# Patient Record
Sex: Female | Born: 1970
Health system: Northeastern US, Community
[De-identification: ages and names within clinical notes are randomized; demographics above are authoritative.]

## PROBLEM LIST (undated history)

## (undated) DIAGNOSIS — N390 Urinary tract infection, site not specified: Secondary | ICD-10-CM

## (undated) DIAGNOSIS — Q909 Down syndrome, unspecified: Secondary | ICD-10-CM

## (undated) DIAGNOSIS — I10 Essential (primary) hypertension: Secondary | ICD-10-CM

## (undated) DIAGNOSIS — N200 Calculus of kidney: Secondary | ICD-10-CM

## (undated) HISTORY — DX: Urinary tract infection, site not specified: N39.0

## (undated) HISTORY — DX: Down syndrome, unspecified: Q90.9

## (undated) HISTORY — PX: TONSILLECTOMY ONE-HALF AGE 12/>: ENT171

## (undated) HISTORY — DX: Essential (primary) hypertension: I10

## (undated) HISTORY — DX: Calculus of kidney: N20.0

---

## 1998-11-18 ENCOUNTER — Encounter (HOSPITAL_BASED_OUTPATIENT_CLINIC_OR_DEPARTMENT_OTHER): Payer: Self-pay

## 1998-11-18 ENCOUNTER — Ambulatory Visit (HOSPITAL_BASED_OUTPATIENT_CLINIC_OR_DEPARTMENT_OTHER): Payer: Self-pay | Admitting: Internal Medicine

## 1998-11-18 LAB — SURGICAL PATH SPECIMEN

## 1998-12-10 ENCOUNTER — Ambulatory Visit (HOSPITAL_BASED_OUTPATIENT_CLINIC_OR_DEPARTMENT_OTHER): Payer: Self-pay

## 1999-03-13 ENCOUNTER — Ambulatory Visit (HOSPITAL_BASED_OUTPATIENT_CLINIC_OR_DEPARTMENT_OTHER): Payer: Self-pay

## 1999-06-23 ENCOUNTER — Ambulatory Visit (HOSPITAL_BASED_OUTPATIENT_CLINIC_OR_DEPARTMENT_OTHER): Payer: Self-pay | Admitting: Internal Medicine

## 1999-08-04 ENCOUNTER — Ambulatory Visit (HOSPITAL_BASED_OUTPATIENT_CLINIC_OR_DEPARTMENT_OTHER): Payer: Self-pay | Admitting: Internal Medicine

## 1999-08-05 LAB — CHG CYTP SLIDES CERV/VAG MNL SCRN PHYSICIAN SUPV

## 1999-12-18 ENCOUNTER — Ambulatory Visit (HOSPITAL_BASED_OUTPATIENT_CLINIC_OR_DEPARTMENT_OTHER): Payer: Self-pay

## 2000-04-05 ENCOUNTER — Ambulatory Visit (HOSPITAL_BASED_OUTPATIENT_CLINIC_OR_DEPARTMENT_OTHER): Payer: Self-pay | Admitting: Internal Medicine

## 2000-06-07 ENCOUNTER — Emergency Department (HOSPITAL_BASED_OUTPATIENT_CLINIC_OR_DEPARTMENT_OTHER): Payer: Self-pay | Admitting: Emergency Medicine

## 2000-06-07 ENCOUNTER — Ambulatory Visit (HOSPITAL_BASED_OUTPATIENT_CLINIC_OR_DEPARTMENT_OTHER): Payer: Self-pay

## 2000-06-27 ENCOUNTER — Ambulatory Visit (HOSPITAL_BASED_OUTPATIENT_CLINIC_OR_DEPARTMENT_OTHER): Payer: Self-pay | Admitting: Internal Medicine

## 2000-10-17 ENCOUNTER — Ambulatory Visit (HOSPITAL_BASED_OUTPATIENT_CLINIC_OR_DEPARTMENT_OTHER): Payer: Self-pay | Admitting: Internal Medicine

## 2000-10-17 LAB — IADNA CHLAMYDIA TRACHOMATIS DIRECT PROBE TQ: GENPROBE CHLAMYDIA: NEGATIVE

## 2000-10-17 LAB — CHG CYTP SLIDES CERV/VAG MNL SCRN PHYSICIAN SUPV

## 2000-10-17 LAB — IADNA NEISSERIA GONORRHOEAE DIRECT PROBE TQ: GENPROBE GC: NEGATIVE

## 2001-08-05 ENCOUNTER — Emergency Department (HOSPITAL_BASED_OUTPATIENT_CLINIC_OR_DEPARTMENT_OTHER): Payer: Self-pay | Admitting: Emergency Medicine

## 2001-08-05 LAB — IADNA NEISSERIA GONORRHOEAE DIRECT PROBE TQ: GENPROBE GC: NEGATIVE

## 2001-08-05 LAB — IADNA CHLAMYDIA TRACHOMATIS DIRECT PROBE TQ: GENPROBE CHLAMYDIA: NEGATIVE

## 2001-08-05 LAB — URINE CULTURE/COLONY COUNT: URINE CULTURE/COLONY COUNT: NO GROWTH

## 2001-08-22 ENCOUNTER — Ambulatory Visit (HOSPITAL_BASED_OUTPATIENT_CLINIC_OR_DEPARTMENT_OTHER): Payer: Self-pay | Admitting: Internal Medicine

## 2001-08-22 LAB — IADNA NEISSERIA GONORRHOEAE DIRECT PROBE TQ: GENPROBE GC: NEGATIVE

## 2001-08-22 LAB — CYTOPATH, C/V, THIN LAYER

## 2001-08-22 LAB — IADNA CHLAMYDIA TRACHOMATIS DIRECT PROBE TQ: GENPROBE CHLAMYDIA: NEGATIVE

## 2002-10-19 ENCOUNTER — Emergency Department (HOSPITAL_BASED_OUTPATIENT_CLINIC_OR_DEPARTMENT_OTHER): Payer: Self-pay

## 2002-10-22 ENCOUNTER — Ambulatory Visit (HOSPITAL_BASED_OUTPATIENT_CLINIC_OR_DEPARTMENT_OTHER): Payer: Self-pay

## 2002-10-22 ENCOUNTER — Encounter (HOSPITAL_BASED_OUTPATIENT_CLINIC_OR_DEPARTMENT_OTHER): Payer: Self-pay | Admitting: Internal Medicine

## 2002-11-01 ENCOUNTER — Ambulatory Visit: Payer: Self-pay | Admitting: Ophthalmology

## 2002-11-02 ENCOUNTER — Ambulatory Visit: Payer: Self-pay | Admitting: Ophthalmology

## 2002-11-05 ENCOUNTER — Other Ambulatory Visit (HOSPITAL_BASED_OUTPATIENT_CLINIC_OR_DEPARTMENT_OTHER): Payer: Self-pay | Admitting: Ophthalmology

## 2002-11-07 LAB — CT CORONAL VIEW-ADDITIONAL VIE

## 2002-11-07 LAB — CT ORBITS W & WO CONTRAST

## 2002-11-07 LAB — CT HEAD W CONTRAST

## 2002-11-16 ENCOUNTER — Ambulatory Visit (HOSPITAL_BASED_OUTPATIENT_CLINIC_OR_DEPARTMENT_OTHER): Payer: Self-pay

## 2002-11-28 ENCOUNTER — Ambulatory Visit (HOSPITAL_BASED_OUTPATIENT_CLINIC_OR_DEPARTMENT_OTHER): Payer: Self-pay

## 2002-12-05 ENCOUNTER — Ambulatory Visit (HOSPITAL_BASED_OUTPATIENT_CLINIC_OR_DEPARTMENT_OTHER): Payer: Self-pay | Admitting: Internal Medicine

## 2002-12-22 LAB — URINALYSIS
BILIRUBIN, URINE: NEGATIVE
GLUCOSE, URINE: NEGATIVE MG/DL
KETONE, URINE: NEGATIVE MG/DL
LEUKOCYTE ESTERASE: NEGATIVE
NITRITE, URINE: NEGATIVE
OCCULT BLOOD, URINE: NEGATIVE
PH URINE: 6 (ref 5.0–8.0)
PROTEIN, URINE: NEGATIVE MG/DL
SPECIFIC GRAVITY URINE: 1.025 (ref 1.003–1.035)

## 2002-12-24 ENCOUNTER — Ambulatory Visit (HOSPITAL_BASED_OUTPATIENT_CLINIC_OR_DEPARTMENT_OTHER): Payer: Self-pay | Admitting: Internal Medicine

## 2002-12-24 ENCOUNTER — Ambulatory Visit (HOSPITAL_BASED_OUTPATIENT_CLINIC_OR_DEPARTMENT_OTHER): Payer: Self-pay

## 2002-12-27 ENCOUNTER — Ambulatory Visit (HOSPITAL_BASED_OUTPATIENT_CLINIC_OR_DEPARTMENT_OTHER): Payer: Self-pay | Admitting: Internal Medicine

## 2002-12-28 ENCOUNTER — Emergency Department (HOSPITAL_BASED_OUTPATIENT_CLINIC_OR_DEPARTMENT_OTHER): Payer: Self-pay | Admitting: Emergency Medicine

## 2003-01-01 ENCOUNTER — Ambulatory Visit (HOSPITAL_BASED_OUTPATIENT_CLINIC_OR_DEPARTMENT_OTHER): Payer: Self-pay | Admitting: Dermatology

## 2003-01-01 ENCOUNTER — Ambulatory Visit (HOSPITAL_BASED_OUTPATIENT_CLINIC_OR_DEPARTMENT_OTHER): Payer: Self-pay | Admitting: Internal Medicine

## 2003-02-06 ENCOUNTER — Ambulatory Visit (HOSPITAL_BASED_OUTPATIENT_CLINIC_OR_DEPARTMENT_OTHER): Payer: Self-pay | Admitting: Internal Medicine

## 2003-02-27 ENCOUNTER — Ambulatory Visit (HOSPITAL_BASED_OUTPATIENT_CLINIC_OR_DEPARTMENT_OTHER): Payer: Self-pay | Admitting: Dermatology

## 2003-02-27 ENCOUNTER — Ambulatory Visit (HOSPITAL_BASED_OUTPATIENT_CLINIC_OR_DEPARTMENT_OTHER): Payer: Self-pay | Admitting: Internal Medicine

## 2003-02-27 LAB — CYTOPATH, C/V, THIN LAYER

## 2003-03-12 ENCOUNTER — Ambulatory Visit (HOSPITAL_BASED_OUTPATIENT_CLINIC_OR_DEPARTMENT_OTHER): Payer: Self-pay

## 2003-03-12 LAB — TSH (THYROID STIMULATING HORMONE): TSH (THYROID STIM HORMONE): 0.41 u[IU]/mL (ref 0.34–5.60)

## 2003-03-21 ENCOUNTER — Ambulatory Visit (HOSPITAL_BASED_OUTPATIENT_CLINIC_OR_DEPARTMENT_OTHER): Payer: Self-pay | Admitting: Urology

## 2003-03-26 ENCOUNTER — Ambulatory Visit (HOSPITAL_BASED_OUTPATIENT_CLINIC_OR_DEPARTMENT_OTHER): Payer: Self-pay

## 2003-03-28 ENCOUNTER — Other Ambulatory Visit (HOSPITAL_BASED_OUTPATIENT_CLINIC_OR_DEPARTMENT_OTHER): Payer: Self-pay | Admitting: Internal Medicine

## 2003-04-02 LAB — CT CORONAL VIEW-ADDITIONAL VIE

## 2003-04-02 LAB — CT SINUS WO CONTRAST

## 2003-04-03 ENCOUNTER — Encounter (HOSPITAL_BASED_OUTPATIENT_CLINIC_OR_DEPARTMENT_OTHER): Payer: Charity | Admitting: Dermatology

## 2003-04-03 ENCOUNTER — Encounter (HOSPITAL_BASED_OUTPATIENT_CLINIC_OR_DEPARTMENT_OTHER): Payer: Self-pay | Admitting: Dermatology

## 2003-04-04 ENCOUNTER — Ambulatory Visit (HOSPITAL_BASED_OUTPATIENT_CLINIC_OR_DEPARTMENT_OTHER): Payer: Self-pay

## 2003-04-11 ENCOUNTER — Ambulatory Visit (HOSPITAL_BASED_OUTPATIENT_CLINIC_OR_DEPARTMENT_OTHER): Payer: Self-pay

## 2003-04-18 ENCOUNTER — Ambulatory Visit (HOSPITAL_BASED_OUTPATIENT_CLINIC_OR_DEPARTMENT_OTHER): Payer: Self-pay

## 2003-04-25 ENCOUNTER — Ambulatory Visit (HOSPITAL_BASED_OUTPATIENT_CLINIC_OR_DEPARTMENT_OTHER): Payer: Self-pay

## 2003-05-02 ENCOUNTER — Ambulatory Visit (HOSPITAL_BASED_OUTPATIENT_CLINIC_OR_DEPARTMENT_OTHER): Payer: Self-pay

## 2003-05-02 ENCOUNTER — Ambulatory Visit (HOSPITAL_BASED_OUTPATIENT_CLINIC_OR_DEPARTMENT_OTHER): Payer: Self-pay | Admitting: Internal Medicine

## 2003-05-06 ENCOUNTER — Encounter (HOSPITAL_BASED_OUTPATIENT_CLINIC_OR_DEPARTMENT_OTHER): Payer: Self-pay

## 2003-05-15 ENCOUNTER — Ambulatory Visit (HOSPITAL_BASED_OUTPATIENT_CLINIC_OR_DEPARTMENT_OTHER): Payer: Self-pay | Admitting: Internal Medicine

## 2003-06-06 ENCOUNTER — Ambulatory Visit (HOSPITAL_BASED_OUTPATIENT_CLINIC_OR_DEPARTMENT_OTHER): Payer: Self-pay

## 2003-07-24 ENCOUNTER — Ambulatory Visit (HOSPITAL_BASED_OUTPATIENT_CLINIC_OR_DEPARTMENT_OTHER): Payer: Self-pay | Admitting: Internal Medicine

## 2003-08-08 ENCOUNTER — Ambulatory Visit (HOSPITAL_BASED_OUTPATIENT_CLINIC_OR_DEPARTMENT_OTHER): Payer: Self-pay

## 2003-10-03 ENCOUNTER — Encounter (HOSPITAL_BASED_OUTPATIENT_CLINIC_OR_DEPARTMENT_OTHER): Payer: Charity

## 2003-10-03 ENCOUNTER — Encounter (HOSPITAL_BASED_OUTPATIENT_CLINIC_OR_DEPARTMENT_OTHER): Payer: Self-pay

## 2006-01-27 ENCOUNTER — Emergency Department: Payer: Self-pay | Admitting: Emergency Medicine

## 2006-09-15 ENCOUNTER — Emergency Department: Payer: Self-pay | Admitting: Emergency Medicine

## 2006-09-15 LAB — XR KNEE RIGHT 3 VIEWS

## 2006-09-20 ENCOUNTER — Encounter (HOSPITAL_BASED_OUTPATIENT_CLINIC_OR_DEPARTMENT_OTHER): Payer: Charity | Admitting: Physician Assistant

## 2006-09-20 DIAGNOSIS — M238X9 Other internal derangements of unspecified knee: Secondary | ICD-10-CM

## 2006-10-03 ENCOUNTER — Other Ambulatory Visit (HOSPITAL_BASED_OUTPATIENT_CLINIC_OR_DEPARTMENT_OTHER): Payer: Self-pay | Admitting: Orthopaedic Surgery

## 2006-10-04 LAB — MRI KNEE RIGHT WO CONTRAST

## 2006-10-06 LAB — EMERGENCY ROOM NOTE

## 2006-10-13 ENCOUNTER — Ambulatory Visit (HOSPITAL_BASED_OUTPATIENT_CLINIC_OR_DEPARTMENT_OTHER): Payer: PRIVATE HEALTH INSURANCE | Admitting: Orthopaedic Surgery

## 2006-10-13 DIAGNOSIS — M25569 Pain in unspecified knee: Secondary | ICD-10-CM

## 2006-10-24 LAB — ORTHOPEDIC OFFICE NOTE

## 2006-11-09 ENCOUNTER — Ambulatory Visit (HOSPITAL_BASED_OUTPATIENT_CLINIC_OR_DEPARTMENT_OTHER): Payer: PRIVATE HEALTH INSURANCE

## 2008-01-29 ENCOUNTER — Ambulatory Visit (HOSPITAL_BASED_OUTPATIENT_CLINIC_OR_DEPARTMENT_OTHER): Payer: PRIVATE HEALTH INSURANCE

## 2008-03-13 ENCOUNTER — Encounter (HOSPITAL_BASED_OUTPATIENT_CLINIC_OR_DEPARTMENT_OTHER): Payer: Self-pay

## 2008-03-13 ENCOUNTER — Ambulatory Visit (HOSPITAL_BASED_OUTPATIENT_CLINIC_OR_DEPARTMENT_OTHER): Payer: PRIVATE HEALTH INSURANCE

## 2008-03-13 VITALS — BP 128/84 | HR 71 | Temp 98.0°F | Ht 64.1 in | Wt 178.0 lb

## 2008-03-13 DIAGNOSIS — Z0000 Encounter for general adult medical examination without abnormal findings: Secondary | ICD-10-CM

## 2008-03-13 DIAGNOSIS — R102 Pelvic and perineal pain unspecified side: Secondary | ICD-10-CM

## 2008-03-13 LAB — URINALYSIS
BILIRUBIN, URINE: NEGATIVE
CASTS: NONE SEEN PER LPF
CRYSTALS: NONE SEEN
GLUCOSE, URINE: NEGATIVE MG/DL
KETONE, URINE: NEGATIVE MG/DL
LEUKOCYTE ESTERASE: NEGATIVE
NITRITE, URINE: NEGATIVE
PH URINE: 5.5 (ref 5.0–8.0)
PROTEIN, URINE: NEGATIVE MG/DL
SPECIFIC GRAVITY URINE: 1.015 (ref 1.003–1.035)
SQUAMOUS EPITHELIAL CELLS: >10 PER LPF — AB (ref 0–2)
WHITE BLOOD CELLS URINE: NONE SEEN PER HPF (ref 0–4)

## 2008-03-13 LAB — BLOOD COUNT COMPLETE AUTO&AUTO DIFRNTL WBC
BASOPHIL %: 0.9 % (ref 0.0–2.0)
EOSINOPHIL %: 3.3 % (ref 0.0–7.0)
HEMATOCRIT: 40.3 % (ref 36.0–48.0)
HEMOGLOBIN: 13.6 g/dl (ref 12.0–16.0)
LYMPHOCYTE %: 28.1 % (ref 13.0–39.0)
MEAN CORP HGB CONC: 33.7 g/dl (ref 32.0–36.0)
MEAN CORPUSCULAR HGB: 29.1 pg (ref 27.0–33.0)
MEAN CORPUSCULAR VOL: 86.4 fl (ref 80.0–100.0)
MEAN PLATELET VOLUME: 9 fl (ref 6.4–10.8)
MONOCYTE %: 6.1 % (ref 1.0–12.0)
NEUTROPHIL %: 61.6 % (ref 46.0–79.0)
PLATELET COUNT: 228 10*3/uL (ref 150–400)
RBC DISTRIBUTION WIDTH: 13.1 % (ref 11.5–14.3)
RED BLOOD CELL COUNT: 4.67 M/uL (ref 4.50–5.10)
WHITE BLOOD CELL COUNT: 8.9 10*3/uL (ref 4.0–10.8)

## 2008-03-13 LAB — COMPREHENSIVE METABOLIC PANEL
ALANINE AMINOTRANSFERASE: 15 IU/L (ref 7–35)
ALBUMIN: 3.5 g/dl (ref 3.4–4.8)
ALKALINE PHOSPHATASE: 71 IU/L (ref 25–106)
ANION GAP: 5 mmol/L (ref 2–25)
ASPARTATE AMINOTRANSFERASE: 15 IU/L (ref 8–34)
BILIRUBIN TOTAL: 0.5 mg/dl (ref 0.2–1.1)
BUN (UREA NITROGEN): 11 mg/dl (ref 6–20)
CALCIUM: 8.9 mg/dl (ref 8.6–10.0)
CARBON DIOXIDE: 27 mmol/L (ref 22–32)
CHLORIDE: 105 mmol/L (ref 101–111)
CREATININE: 0.7 mg/dl (ref 0.4–1.2)
ESTIMATED GLOMERULAR FILT RATE: >60 mL/min (ref 60–116)
Glucose Random: 84 mg/dl (ref 74–160)
POTASSIUM: 4.2 mmol/L (ref 3.5–5.1)
SODIUM: 137 mmol/L (ref 135–144)
TOTAL PROTEIN: 6.7 g/dl (ref 5.9–7.5)

## 2008-03-13 LAB — CHG SEDIMENTATION RATE RBC NON-AUTOMATED: RBC SEDIMENTATION RATE: 11 MM/HR (ref 0–15)

## 2008-03-13 LAB — CHG LIPID PANEL
Cholesterol: 193 mg/dl (ref 0–200)
HIGH DENSITY LIPOPROTEIN: 40 mg/dl (ref 35–85)
LOW DENSITY LIPOPROTEIN DIRECT: 136 mg/dl — ABNORMAL HIGH (ref 0–100)
RISK FACTOR: 4.8 — ABNORMAL HIGH (ref ?–4.4)
TRIGLYCERIDES: 150 mg/dl (ref 0–150)

## 2008-03-13 LAB — TSH (THYROID STIMULATING HORMONE): TSH (THYROID STIM HORMONE): 0.47 u[IU]/mL (ref 0.34–5.60)

## 2008-03-13 MED ORDER — MOTRIN 800 MG PO TABS
ORAL_TABLET | ORAL | Status: DC
Start: 2008-03-13 — End: 2008-03-20

## 2008-03-14 NOTE — Progress Notes (Signed)
c/c: yearly physical, pelvic pain    Denise Ryan is a 37 year old female, requesting yearly physical,   chornic pelvic pain, CT of abdomen shows no kidney sotne, but repeat ua   shows microscopic hematuria,   PLEASE DRINK 8 GLASSES OF WATER, AND DRINKING CRANBERRY JUICE ( GOOD FOR   KIDNEY AND BLADDER HEALTH) WITH FREQUENT URINATION, AVOID CALCIIUM   TINTAKE, MAINTAIN URINARY TRACT HEALTH. CALL CLINIC FOR ANY FLANK PAIN,   TENDERNESS AT BLADDER AREA    + pelvic pain as well, pelvic ultrasound ordered, for possible ovarian   cyst.       review of system  constitutional: NEG  HEENT:NEG  Cardiac: LDL 131  Resp:NEG  GI:NEG  WU:[id number] pelvic pain   Endocrine:NEG  Skin:NEG  Neuro:NEG  Extrmities: NEG  Psych: NEG    No past medical history on file.    Past Surgical History:   TONSILLECTOMY 1/2 AGE 50/> age 62     Review of patient's family history indicates:   Allergies Mother    Psychiatric Illness Mother    Comment: depression   Dermatology Father    Comment: leprosy    GI Maternal Grandmother    Comment: uterine cancer        Tobacco Use: Quit Packs/Day: Years:    Comment: 1 cig /week    Alcohol Use: Yes    Comment: socially       Review of Patient's Allergies indicates:   Norfloxacin Hives      Current outpatient prescriptions:  MOTRIN 800 MG OR TABS, 1 TABLET 3 TIMES DAILY AS NEEDED with food ( for   pain and inflammaiton ) , Disp: 40, Rfl: 0        Filed Vitals:  ----------------------    03/13/2008     9:40 AM   ----------------------   BP:  128/84    Pulse:  71    Temp:  98 F (36*    TempSrc: Oral    Height:  5' 4.1" (*    Weight:  178 lb (8*    SpO2:  100%   ----------------------    PHYSICAL EXAMINATION      GENERAL: awake and alert and orient timex 3 , no apprent distress  HENNT: EMOI, PERRL, no oral thrush, no erythema at phyanx, good hearing   acuity intacy, tampanic membrance intact  NECK: synnetruc, no Mass, no JVD, range to neck intact  Thyroid: Nontender, no  mass  RESP: Clear to ausc. percussion, no cracle / rale / wheeze  CARDIAC: NL PMI, normal s1,s2 no M/R/G   2+ radial, FEM, Distal pulses bilaterally  Chest: no nipple discharge, non tender  Brests: symmertirical, no skin changes, no lumps, no mass, no erythema  ABD: Good BS, nontender, No scar, No hernia, no hepatomegaly,   nondistension. no suprapubic tendnerness, intermittent pelvic pain, no   rebound, no guarding   MUSC: normal tone, no C/C/E  Skin: No rash, lesions ulers  Nero: CN 2-12 intact, no focal defecit      Psych: Normal Insight and affect  Lymph: no cervical, no supraclavicular, no axillary, an inginal lymph node   enlargement  EXTREMITIES; no pitting edema , no joint swelling or tender, or redness       Assesment and Plan:  Denise Ryan is a 37 year old female    V70.0 Routine General Medical Examination at a Health Care Facility   (primary encounter diagnosis)  Comment: ROUTINE ADULT PREVENTIVE HEALTH QUIDE LINE  Plan:  ROUTINE VENIPUNCTURE, COMPLETE CBC W/AUTO DIFF    WBC, LIPID PANEL, COMPREHENSIVE METABOLIC    PANEL, URINALYSIS, THYROID STIM HORMONE, RBC    SED RATE, NONAUTOMATED, MOTRIN 800 MG OR TABS       625.9D Pelvic Pain  Comment: for possible ovarian cyst  pelvic ultrasound ordered  Plan: MOTRIN 800 MG OR TABS             FOLLOW UP WITH DR. Kathie Rhodes Kendrik Mcshan IN CLINIC WITH RESULT IN 12 WEEKS.   PATIENT AGREES

## 2008-03-20 ENCOUNTER — Ambulatory Visit (HOSPITAL_BASED_OUTPATIENT_CLINIC_OR_DEPARTMENT_OTHER): Payer: PRIVATE HEALTH INSURANCE

## 2008-03-20 VITALS — BP 116/70 | HR 81 | Temp 97.8°F | Wt 178.2 lb

## 2008-03-20 DIAGNOSIS — R102 Pelvic and perineal pain unspecified side: Secondary | ICD-10-CM

## 2008-03-20 DIAGNOSIS — N390 Urinary tract infection, site not specified: Secondary | ICD-10-CM

## 2008-03-20 DIAGNOSIS — N200 Calculus of kidney: Secondary | ICD-10-CM

## 2008-03-20 DIAGNOSIS — N329 Bladder disorder, unspecified: Secondary | ICD-10-CM

## 2008-03-20 MED ORDER — AUGMENTIN 500-125 MG PO TABS
ORAL_TABLET | ORAL | Status: AC
Start: 2008-03-20 — End: 2008-03-27

## 2008-03-20 MED ORDER — DETROL 1 MG PO TABS
ORAL_TABLET | ORAL | Status: AC
Start: 2008-03-20 — End: 2008-05-20

## 2008-03-20 MED ORDER — MOTRIN 800 MG PO TABS
ORAL_TABLET | ORAL | Status: AC
Start: 2008-03-20 — End: 2008-03-27

## 2008-03-27 NOTE — Progress Notes (Signed)
c/c: pelvic pain, dyuria, proteinuria, urinary incontence    ZOX:WRUEAV Hole is a 37 year old female, c/c pelvic pain with dysuria,   + proteinuria, + urianry incotence upon coughing , no fever, no chill,   pelvic ultrsound ordered     review of system  constitutional: NEG  HEENT:NEG  Cardiac: NEG  Resp:NEG  GI:NEG  GU: dysuria, pelvic pain , urinary incontence   Endocrine:NEG  Skin:NEG  Neuro:NEG  Extrmities: NEG  Psych: NEG      Review of Patient's Allergies indicates:   Norfloxacin Hives    Current outpatient prescriptions:  DETROL 1 MG OR TABS, 1 TABLET TWICE DAILY ( for bladder control to prevent   urine leakage ) , Disp: 60, Rfl: 2        Filed Vitals:  ----------------------    03/20/2008     1:30 PM   ----------------------   BP:  116/70    Pulse:  81    Temp:  97.8 F (*    TempSrc: Temporal    Weight:  178 lb 3.*    SpO2:  100%   ----------------------    PHYSICAL EXAMINATION      GENERAL: awake and alert and orient timex 3 , no apprent distress      Thyroid: Nontender, no mass  RESP: Clear to ausc. percussion, no cracle / rale / wheeze  CARDIAC: NL PMI, normal s1,s2 no M/R/G   2+ radial, FEM, Distal pulses bilaterally    ABD: Good BS, nontender, No scar, No hernia, no hepatomegaly, nondistension   R flank pain, raidiated ot hip pain, and then pelvic pain      Assesment and Plan:  Denise Ryan is a 37 year old female    625.9D Pelvic Pain (primary encounter diagnosis)  Comment:   Plan: MOTRIN 800 MG OR TABS       599.0U UTI (Lower Urinary Tract Infection)  Comment:    Plan: URINE DIP (POINT OF CARE), AUGMENTIN 500-125 MG   OR TABS         596.9C Bladder Disease  Comment: urinary incontence  Plan: DETROL 1 MG OR TABS, REFERRAL TO UROLOGY (INT)       592.0J Kidney Stones  Comment: renal ultrasound ordered    PLEASE DRINK 8 GLASSES OF WATER, AND DRINKING CRANBERRY JUICE ( GOOD FOR   KIDNEY AND BLADDER HEALTH) WITH FREQUENT URINATION, AVOID CALCIIUM   TINTAKE, MAINTAIN  URINARY TRACT HEALTH. CALL CLINIC FOR ANY FLANK PAIN,   TENDERNESS AT BLADDER AREA        Plan: AUGMENTIN 500-125 MG OR TABS, MOTRIN 800 MG OR    TABS             FOLLOW UP WITH DR. Kathie Rhodes Suesan Mohrmann IN CLINIC WITH RESULT IN 4 WEEKS.   PATIENT AGREES

## 2008-03-29 ENCOUNTER — Encounter (HOSPITAL_BASED_OUTPATIENT_CLINIC_OR_DEPARTMENT_OTHER): Payer: Self-pay

## 2008-04-02 ENCOUNTER — Ambulatory Visit (HOSPITAL_BASED_OUTPATIENT_CLINIC_OR_DEPARTMENT_OTHER): Payer: Self-pay

## 2008-04-02 LAB — US TRANSVAGINAL NON-OB

## 2008-04-02 LAB — US 3D RENDERING WO DEDICATED WORKSTATION

## 2008-04-02 LAB — US PELVIC NON-PREGNANT

## 2008-04-05 ENCOUNTER — Ambulatory Visit (HOSPITAL_BASED_OUTPATIENT_CLINIC_OR_DEPARTMENT_OTHER): Payer: Self-pay | Admitting: Internal Medicine

## 2008-04-10 ENCOUNTER — Ambulatory Visit (HOSPITAL_BASED_OUTPATIENT_CLINIC_OR_DEPARTMENT_OTHER): Payer: PRIVATE HEALTH INSURANCE

## 2008-04-10 VITALS — BP 104/78 | HR 85 | Temp 98.5°F | Wt 179.0 lb

## 2008-04-10 DIAGNOSIS — R319 Hematuria, unspecified: Secondary | ICD-10-CM

## 2008-04-10 DIAGNOSIS — N200 Calculus of kidney: Secondary | ICD-10-CM

## 2008-04-10 DIAGNOSIS — R102 Pelvic and perineal pain unspecified side: Secondary | ICD-10-CM

## 2008-04-10 DIAGNOSIS — K219 Gastro-esophageal reflux disease without esophagitis: Secondary | ICD-10-CM

## 2008-04-10 HISTORY — DX: Calculus of kidney: N20.0

## 2008-04-10 LAB — URINALYSIS
BILIRUBIN, URINE: NEGATIVE
GLUCOSE, URINE: NEGATIVE MG/DL
KETONE, URINE: NEGATIVE MG/DL
LEUKOCYTE ESTERASE: NEGATIVE
NITRITE, URINE: NEGATIVE
OCCULT BLOOD, URINE: NEGATIVE
PH URINE: 6 (ref 5.0–8.0)
PROTEIN, URINE: NEGATIVE MG/DL
SPECIFIC GRAVITY URINE: 1.01 (ref 1.003–1.035)

## 2008-04-10 LAB — URINE DIP (POINT OF CARE)
BILIRUBIN, URINE: NEGATIVE (ref 0–0)
GLUCOSE, URINE: NEGATIVE mg/dl (ref 0–0)
KETONE, URINE: NEGATIVE mg/dl (ref 0–0)
LEUKOCYTE ESTERASE: NEGATIVE (ref 0–0)
NITRITE, URINE: NEGATIVE
PH URINE: 6.5 (ref 5.0–8.0)
PROTEIN, URINE: NEGATIVE mg/dl (ref 0–15)
SPECIFIC GRAVITY URINE: 1.02 (ref 1.003–1.030)
UROBILINOGEN URINE: 0.2 mg/dl (ref 0.2–1.0)

## 2008-04-10 MED ORDER — ACETAMINOPHEN-CODEINE #3 300-30 MG PO TABS
ORAL_TABLET | ORAL | Status: AC
Start: 2008-04-10 — End: 2008-05-11

## 2008-04-10 MED ORDER — OMEPRAZOLE 20 MG PO CPDR
DELAYED_RELEASE_CAPSULE | ORAL | Status: AC
Start: 2008-04-10 — End: 2008-05-11

## 2008-04-12 LAB — URINE CULTURE/COLONY COUNT

## 2008-04-12 NOTE — Progress Notes (Signed)
c/c: pelvic pain, dysuria, hemautira,     ZOX:WRUEAV Mcgeachy is a 37 year old female      review of system  constitutional: distress due to pelvic pain   HEENT:NEG  Cardiac: NEG  Resp:NEG  GI:NEG  GU: dysuria, pelvic pain , hemauria, E coli uti   Endocrine:NEG  Skin:NEG  Neuro:NEG  Extrmities: NEG  Psych: NEG        Review of Patient's Allergies indicates:   Norfloxacin Hives    Current outpatient prescriptions:  ACETAMINOPHEN-CODEINE #3 300-30 MG OR TABS, 1 TABLETS EVERY 8 HOURS AS   NEEDED for severe pain only ( no driving, it is addictive to the brain ) ,   Disp: 15, Rfl: 0<BR>OMEPRAZOLE 20 MG OR CPDR, 1 CAPSULE DAILY ( for acid   refelx ) , Disp: 30, Rfl: 0<BR>DETROL 1 MG OR TABS, 1 TABLET TWICE DAILY (   for bladder control to prevent urine leakage ) , Disp: 60, Rfl: 2          Filed Vitals:   ----------------------       04/10/2008        10:40 AM     ----------------------     BP:   104/78      Pulse:   85      Temp:   98.5 F (*      TempSrc:  Temporal      Weight:   179 lb (8*      SpO2:   100%     ----------------------       PHYSICAL EXAMINATION      GENERAL: awake and alert and orient timex 3 ,distress due to pelvic pain     RESP: Clear to ausc. percussion, no cracle / rale / wheeze  CARDIAC: NL PMI, normal s1,s2 no M/R/G   2+ radial, FEM, Distal pulses bilaterally      ABD: Good BS, nontender, No scar, No hernia, no hepatomegaly, nondistension    + surprapubic tenderness , no flank pain , no rebound, no guarding   Assesment and Plan:  Gaylen Pereira is a 37 year old female      625.9D Pelvic Pain (primary encounter diagnosis)  Comment: ovairan cyst vs kidne stones   Plan: URINE DIP (POINT OF CARE), URINALYSIS, URINE    CULTURE/COLONY COUNT, ACETAMINOPHEN-CODEINE #3    300-30 MG OR TABS       592.0A Kidney Stone  Comment: pelvic ultrsound ordered, with gram neg uti    Plan: URINALYSIS, URINE CULTURE/COLONY COUNT,    ACETAMINOPHEN-CODEINE #3 300-30 MG OR TABS       599.58F Hematuria  Comment: E coli uti    Plan: URINALYSIS, URINE CULTURE/COLONY COUNT       530.81K GERD (Gastroesophageal Reflux Disease)  Comment: PATIENT EDUCATION ABOUT HEALTHY EATING HABBIT, AVOID 3 HOURS   EATING BEFORE BED TIME, AVID COFFEE, AND ALCHOHOL, SPICY FOOD, CHOCOLATE,   EATING PROPER DIET, 3 MEALS PER DAY, CONSUMED FOOD THAT IS EASY TO DIGEST.    Plan: OMEPRAZOLE 20 MG OR CPDR           FOLLOW UP WITH DR. Kathie Rhodes Ryian Lynde IN CLINIC WITH RESULT IN 12 WEEKS.   PATIENT AGREES

## 2008-05-01 ENCOUNTER — Ambulatory Visit (HOSPITAL_BASED_OUTPATIENT_CLINIC_OR_DEPARTMENT_OTHER): Payer: PRIVATE HEALTH INSURANCE

## 2008-05-01 VITALS — BP 106/70 | HR 97 | Temp 98.3°F | Ht 64.25 in | Wt 177.3 lb

## 2008-05-01 DIAGNOSIS — N200 Calculus of kidney: Secondary | ICD-10-CM

## 2008-05-01 DIAGNOSIS — R21 Rash and other nonspecific skin eruption: Secondary | ICD-10-CM

## 2008-05-01 DIAGNOSIS — B3731 Acute candidiasis of vulva and vagina: Secondary | ICD-10-CM

## 2008-05-01 DIAGNOSIS — R102 Pelvic and perineal pain unspecified side: Secondary | ICD-10-CM

## 2008-05-01 DIAGNOSIS — N390 Urinary tract infection, site not specified: Secondary | ICD-10-CM

## 2008-05-01 DIAGNOSIS — R319 Hematuria, unspecified: Secondary | ICD-10-CM

## 2008-05-01 DIAGNOSIS — B373 Candidiasis of vulva and vagina: Secondary | ICD-10-CM

## 2008-05-01 DIAGNOSIS — N329 Bladder disorder, unspecified: Secondary | ICD-10-CM

## 2008-05-01 HISTORY — DX: Urinary tract infection, site not specified: N39.0

## 2008-05-01 LAB — URINALYSIS
BACTERIA: >50 PER HPF — AB (ref 0–?)
BILIRUBIN, URINE: NEGATIVE
CASTS: NONE SEEN PER LPF
CRYSTALS: NONE SEEN
GLUCOSE, URINE: NEGATIVE MG/DL
KETONE, URINE: NEGATIVE MG/DL
NITRITE, URINE: POSITIVE — AB
PH URINE: 5.5 (ref 5.0–8.0)
PROTEIN, URINE: NEGATIVE MG/DL
SPECIFIC GRAVITY URINE: 1.025 (ref 1.003–1.035)

## 2008-05-01 MED ORDER — AUGMENTIN 500-125 MG PO TABS
ORAL_TABLET | ORAL | Status: DC
Start: 2008-05-01 — End: 2008-05-27

## 2008-05-01 MED ORDER — TRIAMCINOLONE ACETONIDE (TOP) 0.025 % EX OINT
TOPICAL_OINTMENT | CUTANEOUS | Status: AC
Start: 2008-05-01 — End: 2008-10-31

## 2008-05-01 MED ORDER — FLUCONAZOLE 150 MG PO TABS
ORAL_TABLET | ORAL | Status: AC
Start: 2008-05-01 — End: 2008-05-08

## 2008-05-03 LAB — CHLAMYDIA GC NAAT
GENPROBE CHLAMYDIA: NEGATIVE
GENPROBE GC: NEGATIVE

## 2008-05-03 LAB — URINE CULTURE/COLONY COUNT

## 2008-05-03 LAB — HUMAN PAPILLOMAVIRUS (HPV): HUMAN PAPILLOMAVIRUS: NEGATIVE

## 2008-05-09 NOTE — Progress Notes (Signed)
c/c: kidney stone, dysuria, skin rash at extremties, vagianl discharge and   itch sensation    ZOX:WRUEAV Cornwall is a 37 year old female      review of system  constitutional: distress due to pain  HEENT:NEG  Cardiac: NEG  Resp:NEG  GI:NEG  GU: dysuria, history of kidney stones, hematuria, vaignal discharge and   itch sensation   Endocrine:NEG  Skin: skin rash at upper and lower extremties   Neuro:NEG  Extrmities: NEG  Psych: anxious mood due to pain           Review of Patient's Allergies indicates:   Norfloxacin Hives          Current outpatient prescriptions:  AUGMENTIN 500-125 MG OR TABS, 1 TABLET EVERY 12 HOURS FOR 7 DAYS (   antibiotic for Urine infection ) , Disp: 14, Rfl: 0<BR>TRIAMCINOLONE   ACETONIDE (TOP) 0.025 % EX OINT, Applied to affected area twice Daily (   for Allergy dermitis ) , Disp: 1, Rfl: 6<BR>ACETAMINOPHEN-CODEINE #3 300-  30 MG OR TABS, 1 TABLETS EVERY 8 HOURS AS NEEDED for severe pain only (   no driving, it is addictive to the brain ) , Disp: 15, Rfl: 0  OMEPRAZOLE 20 MG OR CPDR, 1 CAPSULE DAILY ( for acid refelx ) , Disp: 30,   Rfl: 0<BR>DETROL 1 MG OR TABS, 1 TABLET TWICE DAILY ( for bladder control   to prevent urine leakage ) , Disp: 60, Rfl: 2          Filed Vitals:   ----------------------       05/01/2008        2:30 PM     ----------------------     BP:   106/70      Pulse:   97      Temp:   98.3 F (*      TempSrc:  Temporal      Height:   5' 4.25" *      Weight:   177 lb 4.*      SpO2:   98%     ----------------------     PHYSICAL EXAMINATION          GENERAL: awake and alert and orient timex 3 , distress due to pain       Thyroid: Nontender, no mass  RESP: Clear to ausc. percussion, no cracle / rale / wheeze  CARDIAC: NL PMI, normal s1,s2 no M/R/G   2+ radial, FEM, Distal pulses bilaterally      ABD: Good BS, nontender, No scar, No hernia, no hepatomegaly, nondistension  + suprapubic tendnerness , mild flank pain , pelvic pain,       GYN: + white vaginal discharge, redness and  irritation at vulva area,   cervical oss is closed, moist, pink in color                Assesment and Plan:    Cindi Ghazarian is a 37 year old female        592.0A Kidney Stone (primary encounter diagnosis)  Comment: symtomatic  Plan: URINALYSIS, URINE CULTURE/COLONY COUNT         V72.31H Gyn Exam  Comment: Follow up cytology report, if abnotmal will refer to GYN    Plan: CYTOPATH, C/V, THIN LAYER, AMPLIFIED GENPROBE    CHLAM/GC, HUMAN PAPILLOMAVIRUS   PATIENT EDUCATION ABOUT THE SELF BREAST EXAMINATION MONTLY ONE WEEK   AFTER MENSES   PATIENT EDUCATION ABOUT THE IMPORTANT TO USE COMDOM TO PROTECT STD ,  HIV ,   HEPATITIS AND HERPES      625.9D Pelvic Pain  Comment:  Plan:    599.20F Hematuria  Comment:   Plan: URINALYSIS, URINE CULTURE/COLONY COUNT       596.9C Bladder Disease  Comment:  Plan: URINALYSIS, URINE CULTURE/COLONY COUNT         599.0U UTI (Lower Urinary Tract Infection)  Comment: symtomatic  Plan: URINALYSIS, URINE CULTURE/COLONY COUNT,    AUGMENTIN 500-125 MG OR TABS       782.1X Rash and Nonspecific Skin Eruption  Comment:     Plan: REFERRAL TO ALLERGY (EXT), TRIAMCINOLONE    ACETONIDE (TOP) 0.025 % EX OINT     112.1AY Yeast Infection of the Vagina  Comment:   Plan: FLUCONAZOLE 150 MG OR TABS             FOLLOW UP WITH DR. Kathie Rhodes Kensie Susman IN CLINIC WITH RESULT IN 12 WEEKS.   PATIENT AGREES

## 2008-05-10 LAB — CYTOPATH, C/V, THIN LAYER

## 2008-05-13 ENCOUNTER — Ambulatory Visit (HOSPITAL_BASED_OUTPATIENT_CLINIC_OR_DEPARTMENT_OTHER): Payer: PRIVATE HEALTH INSURANCE | Admitting: Urology

## 2008-05-13 DIAGNOSIS — N3000 Acute cystitis without hematuria: Secondary | ICD-10-CM

## 2008-05-13 DIAGNOSIS — R350 Frequency of micturition: Secondary | ICD-10-CM

## 2008-05-14 LAB — URINE CULTURE/COLONY COUNT: URINE CULTURE/COLONY COUNT: NO GROWTH

## 2008-05-16 LAB — CYTOLOGY SPECIMEN

## 2008-05-27 ENCOUNTER — Ambulatory Visit (HOSPITAL_BASED_OUTPATIENT_CLINIC_OR_DEPARTMENT_OTHER): Payer: PRIVATE HEALTH INSURANCE

## 2008-05-27 VITALS — BP 120/80 | HR 83 | Temp 98.4°F | Resp 18 | Ht 64.25 in | Wt 178.0 lb

## 2008-05-27 DIAGNOSIS — R42 Dizziness and giddiness: Secondary | ICD-10-CM

## 2008-05-27 DIAGNOSIS — R300 Dysuria: Secondary | ICD-10-CM

## 2008-05-27 NOTE — Progress Notes (Signed)
chief complaint  Not feeling well    Patient Active Problem List:     Pelvic Pain [625.9D]     Bladder Disease [596.9C]     Kidney Stone [592.0A]     Hematuria [599.40F]     GERD (Gastroesophageal Reflux Disease) [530.81K]     UTI (Lower Urinary Tract Infection) [599.0U]     Yeast Infection of the Vagina [112.1AY]      Current outpatient prescriptions prior to encounter:  TRIAMCINOLONE ACETONIDE (TOP) 0.025 % EX OINT Applied to affected area twice  Daily ( for  Allergy dermitis )  Disp: 1 Rfl: 6       SUBJECTIVE:  Pt  Is c/o 4 days of dizziness , she feels like she is going fall but she hasn't. This can happen 3 times in two hours today but lasts only seconds.  Worse when standing, if sits usually gets better.   She is taking one month of macrodantin that Dr. Ricki Miller Rx for her, her bladder symptoms have disappeared which she is very happy about.   She has no fever, sob, palpatations, cp , vom,N,d . Her legs sometimes feel funny but here are no other focal neuro. Symptoms.  OBJECTIVE:  BP 120/80  Pulse 83  Temp (Src) 98.4 F (36.9 C) (Temporal)  Resp 18  Ht 5' 4.25" (1.632 m)  Wt 178 lb (80.74 kg)  SpO2 99%  LMP 05/20/2008  UTI by culture 05/01/08   Well appearing with normal gait and balance,  CN grossly intact  Coordination gd, romberg neg  RRR, no murmur  Chest is clear  780.4A Dizziness  (primary encounter diagnosis)  Comment: very brief, lasting only seconds, not interfering with activities  Possible med s/e, is listed in adverse affects but not common,   Plan:  Reassurance, pt will call for increase in symptoms

## 2008-05-28 MED ORDER — OTHER MEDICATION
Status: AC
Start: 2008-05-27 — End: 2009-05-28

## 2008-06-10 ENCOUNTER — Ambulatory Visit (HOSPITAL_BASED_OUTPATIENT_CLINIC_OR_DEPARTMENT_OTHER): Payer: PRIVATE HEALTH INSURANCE | Admitting: Urology

## 2008-07-16 ENCOUNTER — Telehealth (HOSPITAL_BASED_OUTPATIENT_CLINIC_OR_DEPARTMENT_OTHER): Payer: Self-pay

## 2008-07-16 NOTE — Telephone Encounter (Signed)
Unable to reach pt, LM on VM to call back and make appt with Dr Nedra Hai to f/u dizziness

## 2008-07-16 NOTE — Telephone Encounter (Signed)
Staff Message copied by Ova Freshwater on Tue Jul 16, 2008 6:27 PM  ------   Message from: Murvin Donning   Created: Tue Jul 16, 2008 3:27 PM   Regarding: sick call - dizziness    Denise Ryan [id number], 37 year old, female, Telephone Information:  Home Phone [phone]  Work Phone [phone]      Denise Ryan NUMBER: [phone]    Best time to call back: anytimeCell phone:   Other phone:    Available times:    Patient's language of care: [place]    Patient does not need an interpreter.    Patient's PCP: Eileen Stanford, MD    Person calling on behalf of patient: Patient (self)    Calls today with a sick call.Pt said she saw Dr. Lauris Poag with dizziness a month ago. Call today with the same symptons. She would like To speak to the nurse.    Thanks    Patient's Preferred Pharmacy:   [facility]  Phone: [phone] Fax: [phone]

## 2008-07-17 ENCOUNTER — Ambulatory Visit (HOSPITAL_BASED_OUTPATIENT_CLINIC_OR_DEPARTMENT_OTHER): Payer: PRIVATE HEALTH INSURANCE

## 2008-07-17 VITALS — BP 100/70 | HR 79 | Temp 98.4°F | Wt 181.0 lb

## 2008-07-17 DIAGNOSIS — R42 Dizziness and giddiness: Secondary | ICD-10-CM

## 2008-07-17 LAB — SURG SPEC CLINIC NOTE

## 2008-07-17 NOTE — Progress Notes (Signed)
Pt w/in c/o feeling dizzy for 2-3 seconds off/on for the last 30 days.  Denied HA, blurred vision, chest pain, nausea, emesis, no increase of pulse, lightheadedness.  Denied taking medication OTC.   Symptoms  Happen at different time and situations. She can be walking, talking, or moving from bed/chair.  Advised by protocol - home care instructions for dizziness-  Advised if symptoms get worse, to call back or to go to ED.  Appt made to see Dr. Nedra Hai next week.  Pt agrees with the plan.

## 2008-07-23 ENCOUNTER — Ambulatory Visit (HOSPITAL_BASED_OUTPATIENT_CLINIC_OR_DEPARTMENT_OTHER): Payer: PRIVATE HEALTH INSURANCE

## 2008-07-23 VITALS — BP 124/80 | HR 73 | Temp 97.7°F | Wt 184.5 lb

## 2008-07-23 DIAGNOSIS — J019 Acute sinusitis, unspecified: Secondary | ICD-10-CM

## 2008-07-23 DIAGNOSIS — R42 Dizziness and giddiness: Secondary | ICD-10-CM

## 2008-07-23 DIAGNOSIS — J3089 Other allergic rhinitis: Secondary | ICD-10-CM

## 2008-07-23 DIAGNOSIS — H6690 Otitis media, unspecified, unspecified ear: Secondary | ICD-10-CM

## 2008-07-23 LAB — BLOOD COUNT COMPLETE AUTO&AUTO DIFRNTL WBC
BASOPHIL %: 1.8 % (ref 0.0–2.0)
EOSINOPHIL %: 4.2 % (ref 0.0–7.0)
HEMATOCRIT: 40.2 % (ref 36.0–48.0)
HEMOGLOBIN: 13.4 g/dl (ref 12.0–16.0)
LYMPHOCYTE %: 36.7 % (ref 13.0–39.0)
MEAN CORP HGB CONC: 33.2 g/dl (ref 32.0–36.0)
MEAN CORPUSCULAR HGB: 29 pg (ref 27.0–33.0)
MEAN CORPUSCULAR VOL: 87.2 fl (ref 80.0–100.0)
MEAN PLATELET VOLUME: 9 fl (ref 6.4–10.8)
MONOCYTE %: 8.3 % (ref 1.0–12.0)
NEUTROPHIL %: 49 % (ref 46.0–79.0)
PLATELET COUNT: 229 10*3/uL (ref 150–400)
RBC DISTRIBUTION WIDTH: 12.9 % (ref 11.5–14.3)
RED BLOOD CELL COUNT: 4.61 M/uL (ref 4.50–5.10)
WHITE BLOOD CELL COUNT: 9.7 10*3/uL (ref 4.0–10.8)

## 2008-07-23 LAB — COMPREHENSIVE METABOLIC PANEL
ALANINE AMINOTRANSFERASE: 18 IU/L (ref 7–35)
ALBUMIN: 3.4 g/dl (ref 3.4–4.8)
ALKALINE PHOSPHATASE: 70 IU/L (ref 25–106)
ANION GAP: 9 mmol/L (ref 2–25)
ASPARTATE AMINOTRANSFERASE: 18 IU/L (ref 8–34)
BILIRUBIN TOTAL: 0.3 mg/dl (ref 0.2–1.1)
BUN (UREA NITROGEN): 12 mg/dl (ref 6–20)
CALCIUM: 8.8 mg/dl (ref 8.6–10.3)
CARBON DIOXIDE: 28 mmol/L (ref 22–32)
CHLORIDE: 104 mmol/L (ref 101–111)
CREATININE: 0.7 mg/dl (ref 0.4–1.2)
ESTIMATED GLOMERULAR FILT RATE: >60 mL/min (ref 60–116)
Glucose Random: 70 mg/dl — ABNORMAL LOW (ref 74–160)
POTASSIUM: 3.9 mmol/L (ref 3.5–5.1)
SODIUM: 141 mmol/L (ref 135–144)
TOTAL PROTEIN: 6.4 g/dl (ref 5.9–7.5)

## 2008-07-23 MED ORDER — AUGMENTIN 875-125 MG PO TABS
ORAL_TABLET | ORAL | Status: AC
Start: 2008-07-23 — End: 2008-08-06

## 2008-07-23 MED ORDER — FLONASE 50 MCG/ACT NA SUSP
NASAL | Status: AC
Start: 2008-07-23 — End: 2008-08-22

## 2008-07-23 MED ORDER — MECLIZINE HCL 12.5 MG PO TABS
ORAL_TABLET | ORAL | Status: AC
Start: 2008-07-23 — End: 2008-08-22

## 2008-07-23 MED ORDER — B COMPLEX + C TR PO TBCR
EXTENDED_RELEASE_TABLET | ORAL | Status: DC
Start: 2008-07-23 — End: 2010-03-30

## 2008-07-29 NOTE — Progress Notes (Signed)
c/c: allergy sinuisits, vertigo, ear pain    Denise Ryan is a 38 year old female    review of system  constitutional: NEG  HEENT:NEG  Cardiac: NEG  Resp:NEG  GI:NEG  GU:NEG  Endocrine:NEG  Skin:NEG  Neuro:NEG  Extrmities: NEG  Psych: NEG      Review of Patient's Allergies indicates:   Norfloxacin Hives    Current outpatient prescriptions:  AUGMENTIN 875-125 MG OR TABS, 1 TABLET EVERY 12 HOURS for 7 days (   antibtioic for sinus, ear and throat infeciotn ) , Disp: 14, Rfl:   0<BR>FLONASE 50 MCG/ACT NA SUSP, One nasal spray to each nostril twice   daily for 30 days ( for nasal allergy sinusitis ) , Disp: 1, Rfl:   0<BR>MECLIZINE HCL 12.5 MG OR TABS, 1 TABLET 3 TIMES DAILY AS NEEDED for   diziness ( no drving ) , Disp: 20 , Rfl: 0  B COMPLEX + C TR OR TBCR, One tab by mouth daily ( increase immunse   system ) , Disp: 30, Rfl: 12<BR>OTHER MEDICATION, brazilian birth control   pills, Disp: 0, Rfl: 0<BR>TRIAMCINOLONE ACETONIDE (TOP) 0.025 % EX OINT,   Applied to affected area twice Daily ( for Allergy dermitis ) , Disp: 1,   Rfl: 6        Filed Vitals:  ----------------------    07/23/2008     10:20 AM   ----------------------   BP:  124/80    Pulse:  73    Temp:  97.7 F (*    TempSrc: Temporal    Weight:  184 lb 8 *   ----------------------    PHYSICAL EXAMINATION      GENERAL: awake and alert and orient timex 3 , distress due to vertigo   HENNT: EMOI, PERRL, + nasal mucosa erythema and congestion, + pressure and   pain at frontal sinus area , + pain and redness at external auditory   meatus, + no vertigo, no tinnutius no oral thrush, no erythema at phyanx,   good hearing acuity intacy, tampanic membrance intact  NECK: synnetruc, no Mass, no JVD, range to neck intact  Thyroid: Nontender, no mass  RESP: Clear to ausc. percussion, no cracle / rale / wheeze  CARDIAC: NL PMI, normal s1,s2 no M/R/G   2+ radial, FEM, Distal pulses bilaterally      ABD: Good BS, nontender, No scar, No hernia,  no hepatomegaly, nondistension        Assesment and Plan:      Denise Ryan is a 37 year old female      780.4K Vertigo (primary encounter diagnosis)  Comment:     Plan: COMPLETE CBC W/AUTO DIFF WBC, COMPREHENSIVE    METABOLIC PANEL, MECLIZINE HCL 40.9 MG OR TABS       461.9H Sinusitis, Acute  Comment:   Plan: ROUTINE VENIPUNCTURE, COMPLETE CBC W/AUTO DIFF    WBC, COMPREHENSIVE METABOLIC PANEL, AUGMENTIN    875-125 MG OR TABS, FLONASE 50 MCG/ACT NA SUSP       382.9K Middle Ear Infection  Comment:   Plan: AUGMENTIN 875-125 MG OR TABS       477.8 Allergic Rhinitis due to Other Allergen  Comment:     Plan: FLONASE 50 MCG/ACT NA SUSP, B COMPLEX + C TR OR   TBCR             FOLLOW UP WITH DR. Kathie Rhodes Denise Ryan IN CLINIC WITH RESULT IN 12 WEEKS.   PATIENT AGREES

## 2008-08-21 ENCOUNTER — Ambulatory Visit (HOSPITAL_BASED_OUTPATIENT_CLINIC_OR_DEPARTMENT_OTHER): Payer: PRIVATE HEALTH INSURANCE

## 2008-08-21 VITALS — BP 120/84 | HR 72 | Temp 98.0°F | Wt 185.0 lb

## 2008-08-21 DIAGNOSIS — J3089 Other allergic rhinitis: Secondary | ICD-10-CM

## 2008-08-21 DIAGNOSIS — R42 Dizziness and giddiness: Secondary | ICD-10-CM

## 2008-08-21 DIAGNOSIS — H6690 Otitis media, unspecified, unspecified ear: Secondary | ICD-10-CM

## 2008-08-21 DIAGNOSIS — J321 Chronic frontal sinusitis: Secondary | ICD-10-CM

## 2008-08-21 LAB — BLOOD COUNT COMPLETE AUTO&AUTO DIFRNTL WBC
BASOPHIL %: 1.3 % (ref 0.0–2.0)
EOSINOPHIL %: 3.5 % (ref 0.0–7.0)
HEMATOCRIT: 40.2 % (ref 36.0–48.0)
HEMOGLOBIN: 13.6 g/dl (ref 12.0–16.0)
LYMPHOCYTE %: 33.1 % (ref 13.0–39.0)
MEAN CORP HGB CONC: 33.8 g/dl (ref 32.0–36.0)
MEAN CORPUSCULAR HGB: 29.1 pg (ref 27.0–33.0)
MEAN CORPUSCULAR VOL: 86.1 fl (ref 80.0–100.0)
MEAN PLATELET VOLUME: 8.8 fl (ref 6.4–10.8)
MONOCYTE %: 6.9 % (ref 1.0–12.0)
NEUTROPHIL %: 55.2 % (ref 46.0–79.0)
PLATELET COUNT: 240 10*3/uL (ref 150–400)
RBC DISTRIBUTION WIDTH: 13 % (ref 11.5–14.3)
RED BLOOD CELL COUNT: 4.67 M/uL (ref 4.50–5.10)
WHITE BLOOD CELL COUNT: 9.5 10*3/uL (ref 4.0–10.8)

## 2008-08-21 MED ORDER — CORTISPORIN 3.5-10000-1 OT SUSP
OTIC | Status: AC
Start: 2008-08-21 — End: 2008-09-21

## 2008-08-21 MED ORDER — FLONASE 50 MCG/ACT NA SUSP
NASAL | Status: AC
Start: 2008-08-21 — End: 2008-10-21

## 2008-08-21 MED ORDER — AUGMENTIN 500-125 MG PO TABS
ORAL_TABLET | ORAL | Status: AC
Start: 2008-08-21 — End: 2008-09-04

## 2008-08-21 MED ORDER — MEDROL (PAK) 4 MG PO TABS
ORAL_TABLET | ORAL | Status: AC
Start: 2008-08-21 — End: 2008-09-21

## 2008-08-24 DIAGNOSIS — J3089 Other allergic rhinitis: Secondary | ICD-10-CM | POA: Insufficient documentation

## 2008-08-26 NOTE — Progress Notes (Addendum)
c/c: persistant allergy sinusiits, vertigo , ear pain despite   antibitoic treatment. consdier ENT consultation for nasal polyp deviation   or nasal polyps, no fever, no chill, no sob     ZOX:WRUEAV Denise Ryan is a 37 year old female      review of system  constitutional: distress due to vertigo , ear pain   HEENT:allergy sinusitis,m + vertigo   Cardiac: NEG  Resp:NEG  GI:NEG  GU:NEG  Endocrine:NEG  Skin:NEG  Neuro:NEG  Extrmities: NEG  Psych: NEG        Review of Patient's Allergies indicates:   Norfloxacin Hives    Current outpatient prescriptions:  AUGMENTIN 500-125 MG OR TABS, 1 TABLET EVERY 12 HOURS for 10 days (   antibitoic for sinus and ear infection ) , Disp: 20, Rfl: 0<BR>FLONASE 50   MCG/ACT NA SUSP, One nasal spray to each nostril twice daily for 30 days (   for nasal allergy sinusiits ) , Disp: 1, Rfl: 2<BR>MEDROL (PAK) 4 MG OR   TABS, Take it as directed ( low dose prednisone , it can redue   inflammation, pain and swelling of sinus and ear infection ) , Disp: 1,   Rfl: 0  CORTISPORIN 3.5-10000-1 OT SUSP, 3 drops to each ear twice daily for 7   days ( antibtioic ear drop ) , Disp: 1, Rfl: 0<BR>B COMPLEX + C TR OR   TBCR, One tab by mouth daily ( increase immunse system ) , Disp: 30, Rfl:   12<BR>OTHER MEDICATION, brazilian birth control pills, Disp: 0, Rfl:   0<BR>TRIAMCINOLONE ACETONIDE (TOP) 0.025 % EX OINT, Applied to affected   area twice Daily ( for Allergy dermitis ) , Disp: 1, Rfl: 6          Filed Vitals:  ----------------------    08/21/2008     11:40 AM   ----------------------   BP:  120/84    Pulse:  72    Temp:  98 F (36*    TempSrc: Temporal    Weight:  185 lb (8*   ----------------------    PHYSICAL EXAMINATION      GENERAL: awake and alert and orient timex 3 , no apprent distress  HENNT: EMOI, PERRL, no oral thrush, no erythema at phyanx, good hearing   acuity intacy, tampanic membrance intact  NECK: synnetruc, no Mass, no JVD, range to neck intact  Thyroid:  Nontender, no mass  RESP: Clear to ausc. percussion, no cracle / rale / wheeze  CARDIAC: NL PMI, normal s1,s2 no M/R/G   2+ radial, FEM, Distal pulses bilaterally  Chest: no nipple discharge, non tender  Brests: symmertirical, no skin changes, no lumps, no mass, no erythema  ABD: Good BS, nontender, No scar, No hernia, no hepatomegaly, nondistension  MUSC: normal tone, no C/C/E  Skin: No rash, lesions ulers  Nero: CN 2-12 intact, no focal defecit    Psych: Normal Insight and affect, fatitgue         Assesment and Plan:    Denise Ryan is a 37 year old female      780.4K Vertigo (primary encounter diagnosis)  Comment:   Plan: ROUTINE VENIPUNCTURE, COMPLETE CBC W/AUTO DIFF    WBC, CORTISPORIN 3.5-10000-1 OT SUSP, REFERRAL    TO ENT (INT)       473.1E Sinusitis Chronic, Frontal  Comment:   Plan: ROUTINE VENIPUNCTURE, COMPLETE CBC W/AUTO DIFF    WBC, AUGMENTIN 500-125 MG OR TABS, FLONASE 50    MCG/ACT NA SUSP, MEDROL (PAK)  4 MG OR TABS,    CORTISPORIN 3.5-10000-1 OT SUSP, REFERRAL TO    ENT (INT)         382.9A Ear Infection  Comment: symtomatic  , consider ENT consultaiton due to persitant vertigo, consider meniere   disease vs vestibular abnormality   Plan: ROUTINE VENIPUNCTURE, COMPLETE CBC W/AUTO DIFF    WBC, AUGMENTIN 500-125 MG OR TABS, MEDROL (PAK)   4 MG OR TABS, REFERRAL TO ENT (INT)         477.8 Allergic Rhinitis due to Other Allergen  Comment:   Plan: MEDROL (PAK) 4 MG OR TABS                     FOLLOW UP WITH DR. Kathie Rhodes Davari Lopes IN CLINIC WITH RESULT IN 12 WEEKS.   PATIENT AGREES

## 2008-10-09 ENCOUNTER — Ambulatory Visit (HOSPITAL_BASED_OUTPATIENT_CLINIC_OR_DEPARTMENT_OTHER): Payer: PRIVATE HEALTH INSURANCE | Admitting: Otolaryngology

## 2009-10-08 ENCOUNTER — Emergency Department (HOSPITAL_BASED_OUTPATIENT_CLINIC_OR_DEPARTMENT_OTHER)
Admission: RE | Admit: 2009-10-08 | Disposition: A | Discharge status: Home / Self Care | Payer: Self-pay | Source: Emergency Department | Attending: Physician Assistant | Admitting: Physician Assistant

## 2009-10-08 MED ORDER — CLOBETASOL PROPIONATE 0.05 % EX CREA
TOPICAL_CREAM | CUTANEOUS | Status: AC
Start: 2009-10-08 — End: 2009-11-07

## 2009-10-08 NOTE — ED Notes (Signed)
Patient c/o rash x 5 years. States needs rx for clobetasol 0.05%. Unable to see PCP. NAD

## 2009-10-16 LAB — EMERGENCY ROOM NOTE

## 2009-12-19 ENCOUNTER — Ambulatory Visit (HOSPITAL_BASED_OUTPATIENT_CLINIC_OR_DEPARTMENT_OTHER): Payer: PRIVATE HEALTH INSURANCE

## 2009-12-19 ENCOUNTER — Encounter (HOSPITAL_BASED_OUTPATIENT_CLINIC_OR_DEPARTMENT_OTHER): Payer: Self-pay

## 2009-12-19 VITALS — BP 120/80 | HR 77 | Temp 97.9°F | Resp 18 | Ht 64.0 in | Wt 180.0 lb

## 2009-12-19 DIAGNOSIS — M2550 Pain in unspecified joint: Secondary | ICD-10-CM

## 2009-12-19 DIAGNOSIS — E785 Hyperlipidemia, unspecified: Secondary | ICD-10-CM

## 2009-12-19 DIAGNOSIS — M549 Dorsalgia, unspecified: Secondary | ICD-10-CM

## 2009-12-19 DIAGNOSIS — J329 Chronic sinusitis, unspecified: Secondary | ICD-10-CM

## 2009-12-19 DIAGNOSIS — M069 Rheumatoid arthritis, unspecified: Secondary | ICD-10-CM

## 2009-12-19 DIAGNOSIS — M79606 Pain in leg, unspecified: Secondary | ICD-10-CM

## 2009-12-19 DIAGNOSIS — R21 Rash and other nonspecific skin eruption: Secondary | ICD-10-CM

## 2009-12-19 DIAGNOSIS — Z0000 Encounter for general adult medical examination without abnormal findings: Secondary | ICD-10-CM

## 2009-12-19 LAB — CBC, PLATELET & DIFFERENTIAL
BASOPHIL %: 1.6 % (ref 0.0–2.0)
EOSINOPHIL %: 3 % (ref 0.0–7.0)
HEMATOCRIT: 42 % (ref 36.0–48.0)
HEMOGLOBIN: 14 g/dl (ref 12.0–16.0)
LYMPHOCYTE %: 33.3 % (ref 13.0–39.0)
MEAN CORP HGB CONC: 33.3 g/dl (ref 32.0–36.0)
MEAN CORPUSCULAR HGB: 29.4 pg (ref 27.0–33.0)
MEAN CORPUSCULAR VOL: 88.1 fl (ref 80.0–100.0)
MEAN PLATELET VOLUME: 10.2 fl (ref 6.4–10.8)
MONOCYTE %: 7.7 % (ref 1.0–12.0)
NEUTROPHIL %: 54.4 % (ref 46.0–79.0)
PLATELET COUNT: 220 10*3/uL (ref 150–400)
RBC DISTRIBUTION WIDTH: 13.1 % (ref 11.5–14.3)
RED BLOOD CELL COUNT: 4.76 M/uL (ref 4.50–5.10)
WHITE BLOOD CELL COUNT: 9.5 10*3/uL (ref 4.0–10.8)

## 2009-12-19 LAB — CHG SEDIMENTATION RATE RBC NON-AUTOMATED: RBC SEDIMENTATION RATE: 10 MM/HR (ref 0–15)

## 2009-12-19 MED ORDER — CYCLOBENZAPRINE HCL 10 MG PO TABS
ORAL_TABLET | ORAL | Status: AC
Start: 2009-12-19 — End: 2010-01-09

## 2009-12-19 MED ORDER — AZITHROMYCIN 500 MG PO TABS
ORAL_TABLET | ORAL | Status: AC
Start: 2009-12-19 — End: 2009-12-26

## 2009-12-19 MED ORDER — MOTRIN 800 MG PO TABS
ORAL_TABLET | ORAL | Status: DC
Start: 2009-12-19 — End: 2010-03-30

## 2009-12-19 MED ORDER — FLONASE 50 MCG/ACT NA SUSP
NASAL | Status: AC
Start: 2009-12-19 — End: 2010-05-18

## 2009-12-19 MED ORDER — OMEGA-3 1000 MG PO CAPS
ORAL_CAPSULE | ORAL | Status: DC
Start: 2009-12-19 — End: 2010-10-28

## 2009-12-19 MED ORDER — MEDROL (PAK) 4 MG PO TABS
ORAL_TABLET | ORAL | Status: AC
Start: 2009-12-19 — End: 2010-01-16

## 2009-12-19 MED ORDER — CLOBETASOL PROPIONATE 0.05 % EX CREA
TOPICAL_CREAM | CUTANEOUS | Status: DC
Start: 2009-12-19 — End: 2010-10-28

## 2009-12-22 DIAGNOSIS — M069 Rheumatoid arthritis, unspecified: Secondary | ICD-10-CM | POA: Insufficient documentation

## 2009-12-22 LAB — TSH (THYROID STIMULATING HORMONE): TSH (THYROID STIM HORMONE): 0.64 u[IU]/mL (ref 0.34–5.60)

## 2009-12-22 LAB — CHG LIPID PANEL
Cholesterol: 218 mg/dl — ABNORMAL HIGH (ref 0–200)
HIGH DENSITY LIPOPROTEIN: 39 mg/dl (ref 35–85)
LOW DENSITY LIPOPROTEIN DIRECT: 158 mg/dl — ABNORMAL HIGH (ref 0–100)
RISK FACTOR: 5.6 — ABNORMAL HIGH (ref ?–4.4)
TRIGLYCERIDES: 97 mg/dl (ref 0–150)

## 2009-12-22 LAB — COMPREHENSIVE METABOLIC PANEL
ALANINE AMINOTRANSFERASE: 21 IU/L (ref 7–35)
ALBUMIN: 3.8 g/dl (ref 3.4–4.8)
ALKALINE PHOSPHATASE: 86 IU/L (ref 25–106)
ANION GAP: 4 mmol/L (ref 2–25)
ASPARTATE AMINOTRANSFERASE: 17 IU/L (ref 8–34)
BILIRUBIN TOTAL: 0.4 mg/dl (ref 0.2–1.1)
BUN (UREA NITROGEN): 15 mg/dl (ref 6–20)
CALCIUM: 9.3 mg/dl (ref 8.6–10.3)
CARBON DIOXIDE: 26 mmol/L (ref 22–32)
CHLORIDE: 107 mmol/L (ref 101–111)
CREATININE: 0.7 mg/dl (ref 0.4–1.2)
ESTIMATED GLOMERULAR FILT RATE: >60 mL/min (ref >60)
Glucose Random: 84 mg/dl (ref 74–160)
POTASSIUM: 4.4 mmol/L (ref 3.5–5.1)
SODIUM: 137 mmol/L (ref 135–144)
TOTAL PROTEIN: 6.8 g/dl (ref 5.9–7.5)

## 2009-12-22 LAB — RHEUMATOID FACTOR TITER: RHEUMATOID FACTOR TITER: 1:2 {titer}

## 2009-12-22 LAB — CHG RHEUMATOID FACTOR QUALITATIVE: RHEUMATOID FACTOR: POSITIVE — AB

## 2009-12-22 NOTE — Progress Notes (Signed)
[id number] Denise Ryan is a 40 year old female who presents to clinic for  For yearly physical, hypderlipdemia  C/c genearlized joint pain, back pain , leg pain, intermittent stiffness at morning  And swelling, , decreasing ROM at time, + RF , Rheumatology consultation ordered   Your blood work shows elevated cholesterol and bad cholesterol LDL , Hyperlipidemia at age 86 consider nutrtional consultation to prevent future  cardiac event

## 2010-01-09 ENCOUNTER — Ambulatory Visit (HOSPITAL_BASED_OUTPATIENT_CLINIC_OR_DEPARTMENT_OTHER): Payer: PRIVATE HEALTH INSURANCE | Admitting: Otolaryngology

## 2010-01-09 ENCOUNTER — Ambulatory Visit (HOSPITAL_BASED_OUTPATIENT_CLINIC_OR_DEPARTMENT_OTHER): Payer: Self-pay

## 2010-01-09 ENCOUNTER — Encounter (HOSPITAL_BASED_OUTPATIENT_CLINIC_OR_DEPARTMENT_OTHER): Payer: Self-pay | Admitting: Otolaryngology

## 2010-01-09 VITALS — BP 116/78 | HR 86 | Temp 98.0°F

## 2010-01-09 DIAGNOSIS — J018 Other acute sinusitis: Secondary | ICD-10-CM

## 2010-01-09 MED ORDER — AMOXICILLIN-POT CLAVULANATE 875-125 MG PO TABS
1.00 | ORAL_TABLET | Freq: Two times a day (BID) | ORAL | Status: AC
Start: 2010-01-09 — End: 2010-01-23

## 2010-01-09 NOTE — Progress Notes (Signed)
Denise Ryan     Date of Birth:  [birth year]     Date:  01/09/2010     Time:  10:02 AM  [facility] Progress Note  PCP requesting consult:  Eileen Stanford, MD  Consult requested for chronic sinusitis, request in EPIC  CC:  sinusitis  HPI:  39 year old female patient who has a sinus infection.  For the past year, her nose is very red on the inside.  One week ago, she started to have eye pain, and if she moves her eyes fast, she gets dizzy.  She has had headaches in the temple on the left intermittently over the past week.  Her PCP started her on antibiotic, but she does not know the name and it is not in EPIC.  Her paperwok shows that she has been given Zithromax.  She has been using the nasal spray fluticasone.  She was given methylprednisolone but she did not fill it.  She has not had nasal or sinus surgery.    Past medical, social and family history reviewed with patient and updated in EPIC.    REVIEW OF SYSTEMS  Fevers:  No  Blurred Vision:  No  Palpitations:  No  Cough:  No  Heartburn:  Yes  Problems with Urination:  No  Stiff Neck:  No  Headache:  Yes  Fullness of the lower neck:  No  Easy Bruising:  Yes  Itchy eyes:  No  Claustrophobia:  Yes  Skin lesions of the face:  No    EXAM  General:  WD WN female with a normal voice.  FACE:  No lesions.  Slightly tender to palpation in the glabella.  FACIAL STRENGTH:  Normal and symmetric  AD:  Ear no lesions.  Canal clear.  TM clear.  AS:  Ear no lesions.  Canal clear.  TM clear.  NOSE:  Septum has bilateral spurs inferiorly.  Turbinates normal size.  No polyps or masses.  NASOPHARYNX:  CNV  ORAL CAVITY/OROPHARYNX:  Mucous membranes moist.  Tonsils 0+.  Tongue no lesions.  Teeth in good condition.    HYPOPHARYNX:  Base of tongue no masses.  Piriform sinuses clear.  LARYNX:  Epiglottis no edema or lesions.  Vocal cords:  mobile with no lesions or polyps.  NECK:  Trachea midline.  No palpable masses.  LYMPH:  No palpable nodes.  SMG's:  Normal size and shape, non-tender.   PAROTIDS:  Normal size, no nodes or masses.    IMPRESSION/PLAN  Probable acute sinusitis.  I have changed the antibiotic to Augmentin as she is allergic to the quinolones.  I have continued the fluticasone.  She does not need to take the medrol dose pack.  I have instructed her to return should this not work or should the symptoms recur, in which case, she may need a CT scan.

## 2010-01-10 LAB — XR THORACIC SPINE 2 VIEWS

## 2010-01-10 LAB — XR CERVICAL SPINE WITH OBLIQUES 5 VIEWS

## 2010-01-10 LAB — XR TIBIA FIBULA RIGHT 2 VIEWS

## 2010-01-14 ENCOUNTER — Ambulatory Visit (HOSPITAL_BASED_OUTPATIENT_CLINIC_OR_DEPARTMENT_OTHER): Payer: PRIVATE HEALTH INSURANCE | Admitting: Rheumatology

## 2010-01-14 VITALS — BP 147/108 | HR 85

## 2010-01-14 DIAGNOSIS — M2140 Flat foot [pes planus] (acquired), unspecified foot: Secondary | ICD-10-CM

## 2010-01-14 DIAGNOSIS — T148XXA Other injury of unspecified body region, initial encounter: Secondary | ICD-10-CM

## 2010-01-14 NOTE — Patient Instructions (Addendum)
Exam does not show rheumatoid arthritis.  Rheumatoid factor blood test is positive at a low level that is not clinically significant.    Posture is correctable.  You need to resume the habit of standing up straight.  Weight gain deposits excess fat behind neck and in breasts and abdomen, as well as other locations.  Weight loss will help your posture and also will help elevated cholesterol and blood pressure.  Also see Dr. Nedra Hai about blood pressure (147/108 and 138/90)  Schedule PT for posture exercise.  Pronation of feet is worsened by overweight and can be relieved by wearing an arch support.  You can get them at athletic stores like [employer].  For weight loss; try to walk daily.  Do not eat between meals.  Eat vegetable oils but little of animal fat.  Do not eat trans saturated fats.  I will send you a letter with the result of the vitamin D test.

## 2010-01-14 NOTE — Progress Notes (Signed)
The primary progress note for this visit has been dictated through E-Scription. It can be viewed as an attachment to this encounter or through Chart Review under the Other Tab as an Rheumatology Office Note.      Review of Systems: Constitutional, Eyes, ENT/Mouth, Cardiovascular, Respiratory, GI, GU, Neuro, Psych, Heme/Lymph, Skin, Musculoskeletal was reviewed and is NEGATIVE except for what is dictated in the note.    MT ACCT #:  [id number]

## 2010-01-15 LAB — RHEUMATOLOGY OFFICE NOTE

## 2010-01-16 LAB — VITAMIN D,25 HYDROXY: VITAMIN D,25 HYDROXY: 19.5 ng/ml — CL (ref 30.0–100.0)

## 2010-01-20 ENCOUNTER — Other Ambulatory Visit (HOSPITAL_BASED_OUTPATIENT_CLINIC_OR_DEPARTMENT_OTHER): Payer: Self-pay | Admitting: Rheumatology

## 2010-01-20 ENCOUNTER — Encounter (HOSPITAL_BASED_OUTPATIENT_CLINIC_OR_DEPARTMENT_OTHER): Payer: Self-pay | Admitting: Rheumatology

## 2010-01-20 MED ORDER — ERGOCALCIFEROL 1.25 MG (50000 UT) PO CAPS
1.0000 | ORAL_CAPSULE | ORAL | Status: DC
Start: 2010-01-20 — End: 2010-10-28

## 2010-02-20 ENCOUNTER — Ambulatory Visit (HOSPITAL_BASED_OUTPATIENT_CLINIC_OR_DEPARTMENT_OTHER): Payer: PRIVATE HEALTH INSURANCE

## 2010-03-27 ENCOUNTER — Emergency Department (HOSPITAL_BASED_OUTPATIENT_CLINIC_OR_DEPARTMENT_OTHER)
Admission: RE | Admit: 2010-03-27 | Disposition: A | Discharge status: Home / Self Care | Payer: Self-pay | Source: Emergency Department | Attending: Emergency Medicine | Admitting: Emergency Medicine

## 2010-03-27 ENCOUNTER — Encounter (HOSPITAL_BASED_OUTPATIENT_CLINIC_OR_DEPARTMENT_OTHER): Payer: Self-pay

## 2010-03-27 NOTE — ED Notes (Signed)
C/o 8/10 h/a for 2 days.  Denies fever or trauma.  Taking motrin with no effect.

## 2010-03-27 NOTE — Discharge Instructions (Signed)
Diagnosis or diagnoses:  headache      What we did in the Emergency Department (ED):  - intravenous fluid  - medicine for headaches:  Reglan, Toradol, Tylenol (acetaminophen, paracetamol)    Next steps:  - drink more fluid!  Juice, soup, milk, water, Gatorade, coffee, tea... You need to drink enough to be peeing every 3 hours.  Your urine should be light straw in color.    - if your headache returns, you can try alternating ibuprofen with Tylenol as follows:       6:00am   Motrin/Advil (ibuprofen)  600 mg = 3 tablets     9:00am   Tylenol (acetaminophen)  975 mg = 3 regular strength tablets     12:00noon   Motrin/Advil     3:00pm   Tylenol     6:00pm   Motrin/Advil     9:00pm   Tylenol     Etcetera      Come back to the Emergency Department (ED) for:  Can't see, can't walk, can't talk, can't feel arm(s) or leg(s), worst headache of life.

## 2010-03-30 ENCOUNTER — Telehealth (HOSPITAL_BASED_OUTPATIENT_CLINIC_OR_DEPARTMENT_OTHER): Payer: Self-pay | Admitting: Ambulatory Care

## 2010-03-30 ENCOUNTER — Ambulatory Visit (HOSPITAL_BASED_OUTPATIENT_CLINIC_OR_DEPARTMENT_OTHER): Payer: PRIVATE HEALTH INSURANCE

## 2010-03-30 ENCOUNTER — Encounter (HOSPITAL_BASED_OUTPATIENT_CLINIC_OR_DEPARTMENT_OTHER): Payer: Self-pay

## 2010-03-30 VITALS — BP 130/80 | HR 60 | Temp 97.8°F | Resp 20 | Wt 175.0 lb

## 2010-03-30 DIAGNOSIS — R51 Headache: Secondary | ICD-10-CM

## 2010-03-30 DIAGNOSIS — H109 Unspecified conjunctivitis: Secondary | ICD-10-CM

## 2010-03-30 MED ORDER — CYCLOBENZAPRINE HCL 10 MG PO TABS
10.00 mg | ORAL_TABLET | Freq: Three times a day (TID) | ORAL | Status: AC | PRN
Start: 2010-03-30 — End: 2010-04-04

## 2010-03-30 MED ORDER — ERYTHROMYCIN 5 MG/GM OP OINT
TOPICAL_OINTMENT | Freq: Every evening | OPHTHALMIC | Status: AC
Start: 2010-03-30 — End: 2010-04-06

## 2010-03-30 NOTE — Telephone Encounter (Signed)
Message copied by Milus Mallick on Mon Mar 30, 2010 10:52 AM  ------       Message from: Larena Glassman       Created: Mon Mar 30, 2010 10:41 AM       Regarding: Er f/u headaches       Contact: [phone]         [facility]              Ovida Delagarza [id number], 39 year old, female, Telephone Information:       Home Phone      [phone]       Work Phone      Not on file.       Mobile          [phone]                     Cleotis Lema NUMBER: [phone]       Best time to call back:        Cell phone:        Other phone:              Available times:              Patient's language of care: [place]              Patient does not need an interpreter.              Patient's PCP: Eileen Stanford, MD              Person calling on behalf of patient: Patient (self)              Calls today        Er f/u headaches,not feeling better,medication given making her vomit.              Patient's Preferred Pharmacy:        [facility] ([facility]              Phone: [phone] Fax: [phone]

## 2010-03-30 NOTE — Progress Notes (Signed)
SUBJECTIVE:Callee Record is a 39 year old female who presents today for the first time with me for  f/u of an ED visit for left sided headache.    Complains of  left sided headache and eye pain x 4 days.  Headache happens daily,  is located in the left hemicranium, it is pulsatile, 8/10, no aura.  Also reports left sided neck pain, worse when she moves the neck.Was given motrim and tylenol , but it did not help      No cough  No post nasal drip  No nose obstruction  No sneezing  No photophonophobia, no nausea      Had an episode of left sided sinusitis treated in Feb 2011 with augmentin with good results. Not using flonase    The patient has been in otherwise good general health in the past.    Review of systems:  She denies fever.  No syncope.  No vision changes, but pain in the left eye for 1 day.  No excess lacrimation  Some epigastric pain after took motrim  Lots of stress @ work and at home    I have reviewed the patient's medical history in detail and updated the computerized patient record.    Review of patient's Past Medical History indicates:  Patient Active Problem List:     Pelvic Pain [625.9D]     Bladder Disease [596.9C]     Hematuria [599.41F]     GERD (Gastroesophageal Reflux Disease) [530.81K]     UTI (Lower Urinary Tract Infection) [599.0U]     Yeast Infection of the Vagina [112.1AY]     Allergic Rhinitis due to Other Allergen [477.8]     Pes planus [734L]        Current outpatient prescriptions:    Current outpatient prescriptions ordered prior to encounter:  ergocalciferol (ERGOCALCIFEROL) 50000 UNIT capsule Take 1 capsule by mouth once a week. Disp: 4 capsule Rfl: 2   OMEGA-3 1000 MG CAPS 3 CAPSULES DAILY WITH A MEAL  ( lower  The cholesterol /good for the joint ) Disp: 90 capsule Rfl: 12   clobetasol (TEMOVATE) 0.05 % CREA Applied to affected area twice daily  ( topical use ) Disp: 1 tube Rfl: 12   fluticasone (FLONASE) 50 MCG/ACT SUSP One nasal spray to each nostril twice daily  for 30 days  ( for nasal allergy sinusitis ) Disp: 12 g Rfl: 5             OBJECTIVE:Vital signs are as noted by the nurse.BP 130/80  Pulse 60  Temp(Src) 97.8 F (36.6 C) (Temporal)  Resp 20  Wt 175 lb (79.379 kg)  SpO2 94%  LMP 03/14/2010  SHe appears well, in no apparent distress.  Alert and oriented times three, pleasant and cooperative.     Fundi normal  No tenderness on palpation of sinuses  ENT: normal, neck supple, free of adenopathy or masses. Left red conjunctiva  Tenderness on palpation of neck muscles on the left  Extremities: Peripheral pulses are palpable.No edema.Reflexes are normal.  Neurological exam is normal without focal findings.  Skin: is normal without suspicious lesions.    ASSESSMENT/PLAN:  1. Headache: no neurologic symptoms. Unclear etiology:tension headache with component of MSK pain, vs migrane complicated by medication overuse? I do not think she is having now a sinus headache, but advised her to resume her flonase . The pain is also not intense or short enough for me to think of cluster headache. Reassurance. Advised to stop motrim  and tylenol. Try cyclobenzaprine for 5-7 days. Warm compresses in the left side of neck.     Patient will call in 5 days. If not improved by the end of the week, f/u appointment.    2. Left eye erythema: likely viral conjunctivitis. Treat with erthromycin op daily for 7 days

## 2010-03-30 NOTE — Telephone Encounter (Signed)
Called and spoke with pt and she said that she went to the ER last Friday for HA and was given some meds but the meds are making her stomach sick and vomiting.  Pt said that she is taking ibuprofen 3 times a day  Pain 8/10 now with some eye pressure pain.    Denies dizziness, SOB/CP at 340 pm

## 2010-03-31 ENCOUNTER — Ambulatory Visit (HOSPITAL_BASED_OUTPATIENT_CLINIC_OR_DEPARTMENT_OTHER): Payer: Self-pay | Admitting: Registered"

## 2010-05-18 LAB — EMERGENCY ROOM NOTE

## 2010-10-28 ENCOUNTER — Encounter (HOSPITAL_BASED_OUTPATIENT_CLINIC_OR_DEPARTMENT_OTHER): Payer: Self-pay

## 2010-10-28 ENCOUNTER — Ambulatory Visit (HOSPITAL_BASED_OUTPATIENT_CLINIC_OR_DEPARTMENT_OTHER): Payer: PRIVATE HEALTH INSURANCE

## 2010-10-28 VITALS — BP 120/84 | HR 74 | Temp 97.2°F | Resp 18 | Wt 182.0 lb

## 2010-10-28 DIAGNOSIS — N979 Female infertility, unspecified: Secondary | ICD-10-CM

## 2010-10-28 DIAGNOSIS — R21 Rash and other nonspecific skin eruption: Secondary | ICD-10-CM

## 2010-10-28 DIAGNOSIS — Z23 Encounter for immunization: Secondary | ICD-10-CM

## 2010-10-28 DIAGNOSIS — E559 Vitamin D deficiency, unspecified: Secondary | ICD-10-CM

## 2010-10-28 LAB — CBC, PLATELET & DIFFERENTIAL
BASOPHIL %: 1.4 % (ref 0.0–2.0)
EOSINOPHIL %: 3 % (ref 0.0–7.0)
HEMATOCRIT: 40.2 % (ref 36.0–48.0)
HEMOGLOBIN: 13.7 g/dl (ref 12.0–16.0)
LYMPHOCYTE %: 40.8 % — ABNORMAL HIGH (ref 13.0–39.0)
MEAN CORP HGB CONC: 34 g/dl (ref 32.0–36.0)
MEAN CORPUSCULAR HGB: 29.4 pg (ref 27.0–33.0)
MEAN CORPUSCULAR VOL: 86.3 fl (ref 80.0–100.0)
MEAN PLATELET VOLUME: 9.7 fl (ref 6.4–10.8)
MONOCYTE %: 6.8 % (ref 1.0–12.0)
NEUTROPHIL %: 48 % (ref 46.0–79.0)
PLATELET COUNT: 218 10*3/uL (ref 150–400)
RBC DISTRIBUTION WIDTH: 12.8 % (ref 11.5–14.3)
RED BLOOD CELL COUNT: 4.65 M/uL (ref 4.50–5.10)
WHITE BLOOD CELL COUNT: 8.9 10*3/uL (ref 4.0–10.8)

## 2010-10-28 LAB — URINE PREGNANCY TEST (POINT OF CARE): HCG QUALITATIVE URINE: NEGATIVE

## 2010-10-28 MED ORDER — CLOBETASOL PROPIONATE 0.05 % EX CREA
TOPICAL_CREAM | Freq: Two times a day (BID) | CUTANEOUS | Status: DC
Start: 2010-10-28 — End: 2012-01-10

## 2010-10-28 MED ORDER — PRENATAL 1 30-0.975-200 MG PO CAPS
1.0000 | ORAL_CAPSULE | Freq: Every day | ORAL | Status: DC
Start: 2010-10-28 — End: 2011-11-15

## 2010-10-30 LAB — CHG GONADOTROPIN LUTEINIZING HORMONE: LUTEINIZING HORMONE (LH): 9.84 m[IU]/mL

## 2010-10-30 LAB — VITAMIN D,25 HYDROXY: VITAMIN D,25 HYDROXY: 18.2 ng/ml — CL (ref 30.0–100.0)

## 2010-10-30 LAB — CHG GONADOTROPIN FOLLICLE STIMULATING HORMONE: FOLLICLE STIMULATING HORMONE: 10.03 m[IU]/mL

## 2010-10-30 LAB — TSH (THYROID STIMULATING HORMONE): TSH (THYROID STIM HORMONE): 1.07 u[IU]/mL (ref 0.34–5.60)

## 2010-11-02 ENCOUNTER — Encounter (HOSPITAL_BASED_OUTPATIENT_CLINIC_OR_DEPARTMENT_OTHER): Payer: Self-pay

## 2010-11-02 ENCOUNTER — Telehealth (HOSPITAL_BASED_OUTPATIENT_CLINIC_OR_DEPARTMENT_OTHER): Payer: Self-pay

## 2010-11-02 MED ORDER — ERGOCALCIFEROL 1.25 MG (50000 UT) PO CAPS
50000.0000 [IU] | ORAL_CAPSULE | ORAL | Status: DC
Start: 2010-11-02 — End: 2010-11-02

## 2010-11-02 NOTE — Progress Notes (Signed)
C/c: follow up vitmain D deficiency , age 39 , never pregnant, off contracetive for 6 months, like to check any possible abnormality for her infertility, hyperlipidemia , need tetanus shot and skin rash improve with cobetasol requesting for refill     ZOX:WRUEAV Shuffield is a 39 year old female who presents to clinic for  follow up vitmain D deficiency ,Your vitamin D level is low ,   Low vitamin D 25 hydroxyl level, it related to her joint pain,fatigue and depression , please  Using 30 mins  sun exposure  Around 4 pm and alloiwng your skin to make nauture active vitamin D druing pregnancy          age 50 , never pregnant, off contracetive for 6 months, like to check any possible abnormality for her infertility, normal TSH , FSH and LH , gyn consultiaotn and pelvic ultrasound ordered for patient , prenantal vitamin ordered       hyperlipidemia ,  PATIENT EDUCATION ABOUT LOW FAT, LOW SALT, HIGH FIBER, WITH VEGETABLE AND FRUIT, WHEAT, GRAIN, COOKING WITH GARLIC AND OLIVE OIL,  AND PROPER CARDIOVASCULAR EXERCISE REGIMEN.          Patient Active Problem List:     Pelvic Pain [625.9D]     Hematuria [599.72F]     GERD (Gastroesophageal Reflux Disease) [530.81K]     Allergic Rhinitis due to Other Allergen [477.8]     Pes planus [734L]     Infertility, female [628.69M]        Review of Patient's Allergies indicates:   Norfloxacin             Hives      Current outpatient prescriptions:  Prenatal MV-Min-Fe Fum-FA-DHA (PRENATAL 1) 30-0.975-200 MG CAPS Take 1 capsule by mouth daily. Disp: 1 each Rfl: 12   clobetasol (TEMOVATE) 0.05 % cream Apply  topically 2 (two) times daily. Disp: 1 tube Rfl: 12              10/28/10  1154   BP: 120/84   Pulse: 74   Temp: 97.2 F (36.2 C)   TempSrc: Temporal   Resp: 18   Weight: 182 lb (82.555 kg)   SpO2: 100%             constitutional: NEG  HEENT:NEG  Cardiac: NEG  Resp:NEG  GI:NEG  GU:NEG  Endocrine:NEG  Skin:NEG  Neuro:NEG  Extrmities: NEG  Psych: NEG      GENERAL: awake and alert and  orient timex 3 , no apprent distress  HENNT: EMOI, PERRL, no oral thrush, no erythema at phyanx, good hearing acuity intacy, tampanic membrance intact  NECK: synnetruc, no Mass, no JVD, range to neck intact  Thyroid: Nontender, no mass  RESP: Clear to ausc. percussion, no cracle / rale / wheeze  CARDIAC: NL PMI, normal s1,s2 no M/R/G                    2+ radial, FEM, Distal pulses bilaterally  Chest: no nipple discharge, non tender    ABD: Good BS, nontender, No scar, No hernia, no hepatomegaly, nondistension  MUSC: normal tone, no C/C/E  Skin: No rash, lesions ulers  Nero: CN 2-12 intact, no focal defecit      Psych: Normal Insight and affect  Lymph: no cervical, no supraclavicular, no axillary, an inginal lymph node enlargement  EXTREMITIES; no pitting edema , no joint swelling  or tender, or readness      Assesment and Plan:  Ricci Paff is a 39 year old female      628.45M Infertility, female  (primary encounter diagnosis)  Comment:  Negative urine hcg, normal FSH /LH and TSH       , age 89 , never pregnant, off contracetive for 6 months, like to check any possible abnormality for her infertility,        Plan: THYROID STIM HORMONE, FOLLICLE STIMULATING         HORMONE (FSH), LUTEINIZING HORMONE (LH),         COMPLETE CBC W/AUTO DIFF WBC, URINE PREGNANCY         TEST (POINT OF CARE), ORDER FOR ULTRASOUND,         Prenatal MV-Min-Fe Fum-FA-DHA (PRENATAL 1)         30-0.975-200 MG CAPS              V06.5 Need for prophylactic vaccination with tetanus-diphtheria (TD)  Comment:I did ordered tetanus booster vaccination for you , once every every 10 years pvevent infection form open wound during skin injury with open wound       Plan: TETANUS & DIPHTHERIA TOXOIDS ADSORBED 7 YR +         IM, IMMUNIZATION ADMIN SINGLE, RN              782.1R Rash  Comment: Improved with medication , requesting refill       symptomatic, stop using it if pregnancy occurred     Plan: THYROID STIM HORMONE, clobetasol (TEMOVATE)          0.05 % cream             268.9G Vitamin D deficiency  Comment: Your vitamin D level is low ,   Low vitamin D 25 hydroxyl level, it related to her joint pain,fatigue and depression , please  Using 30 mins  sun exposure  Around 4 pm and alloiwng your skin to make nauture active vitamin D druing pregnancy     Plan: VITAMIN D,25 HYDROXY, ergocalciferol          FOLLOW UP WITH DR. Kathie Rhodes Svetlana Bagby IN CLINIC WITH RESULT IN     4    WEEKS. For vitamin D deficiency at level 18  PATIENT AGREES

## 2010-11-02 NOTE — Telephone Encounter (Signed)
the result letter sent to patient     Thank you so much !

## 2010-11-02 NOTE — Telephone Encounter (Signed)
Dear [name] and [name]       Can we call this lovely patient for following issues :        1) the blood test shows very low active vitamin D level, 18 , I did ordered Ergocalciferol 50,000 one tab every Monday and Friday for 16 weeks .  It is not recommended for pregnant and women prepare for pregnancy , her urine pregnancy test is negative     2)She is desire and prepare for future pregnancy now  Taking  prenatal vitamin with infertility work up , if she can not wait for 4 months of treatment, she did not need pick up Ergocalciferol as prescribed     3) the result letter sent to patient     Thank you so much !

## 2010-11-02 NOTE — Telephone Encounter (Signed)
Voice mail to call clinic back using telephone interpreter

## 2010-11-05 ENCOUNTER — Telehealth (HOSPITAL_BASED_OUTPATIENT_CLINIC_OR_DEPARTMENT_OTHER): Payer: Self-pay | Admitting: Registered Nurse

## 2010-11-05 NOTE — Telephone Encounter (Signed)
Message copied by Olevia Bowens on Thu Nov 05, 2010 11:03 AM  ------       Message from: Natalia Leatherwood       Created: Thu Nov 05, 2010 10:53 AM       Regarding: lab result          [facility]              Atiana Levier [id number], 39 year old, female, Telephone Information:       Home Phone      [phone]       Work Phone      Not on file.       Mobile          [phone]                     Cleotis Lema NUMBER: [phone]       Best time to call back: anytime        Cell phone:        Other phone:              Available times:              Patient's language of care: [place]              Patient does not need an interpreter.              Patient's PCP: Eileen Stanford, MD              Person calling on behalf of patient: Patient (self)              Calls today Returning phonecall (pt said she got a vm concerning her latest lab result, pt haven't rec letter               Patient's Preferred Pharmacy:        [facility] ([facility])              Phone: [phone] Fax: [phone]

## 2010-11-05 NOTE — Telephone Encounter (Signed)
Call to patient. Reviewed lab results. To f/u prn.

## 2010-11-05 NOTE — Telephone Encounter (Signed)
Denise Stanford, MD 11/02/10 10:25 AM Signed   Dear [name] and [name]   Can we call this lovely patient for following issues :   1) the blood test shows very low active vitamin D level, 18 , I did ordered Ergocalciferol 50,000 one tab every Monday and Friday for 16 weeks . It is not recommended for pregnant and women prepare for pregnancy , her urine pregnancy test is negative   2)She is desire and prepare for future pregnancy now Taking prenatal vitamin with infertility work up , if she can not wait for 4 months of treatment, she did not need pick up Ergocalciferol as prescribed   3) the result letter sent to patient   Thank you so much !

## 2011-03-26 ENCOUNTER — Encounter (HOSPITAL_BASED_OUTPATIENT_CLINIC_OR_DEPARTMENT_OTHER): Payer: Self-pay | Admitting: Registered Nurse

## 2011-03-26 ENCOUNTER — Ambulatory Visit (HOSPITAL_BASED_OUTPATIENT_CLINIC_OR_DEPARTMENT_OTHER): Payer: No Typology Code available for payment source | Admitting: Registered Nurse

## 2011-03-26 DIAGNOSIS — I10 Essential (primary) hypertension: Secondary | ICD-10-CM

## 2011-03-26 DIAGNOSIS — Z3201 Encounter for pregnancy test, result positive: Secondary | ICD-10-CM

## 2011-03-26 LAB — URINE PREGNANCY TEST (POINT OF CARE): HCG QUALITATIVE URINE: POSITIVE — AB

## 2011-03-26 NOTE — Progress Notes (Signed)
Here for pregnancy test  LMP 02/20/11  Took home test and positive  Wanted to 'be sure'  HCG +  Super excited, thought it might be to late  Already taking pre-natal vitamin  Copy of medications safe in pregnacy given  encouraged to drink lots of water and get plenty of rest  BP high 150/100  Has a headache today that started yesterday  Much better with tylenol  Lied on left side for over 10 minutes  Rechecked 144/96, HR 80  Checked with in house CNM, P.Her  Encouraged to make apt with new PCP for next week  Patient would like to see Dr. Leone Haven made for Wednesday 1130  To report to ED if headaches worsen, sob, or chest pain  Agrees with plan

## 2011-03-31 ENCOUNTER — Ambulatory Visit (HOSPITAL_BASED_OUTPATIENT_CLINIC_OR_DEPARTMENT_OTHER): Payer: No Typology Code available for payment source

## 2011-03-31 ENCOUNTER — Encounter (HOSPITAL_BASED_OUTPATIENT_CLINIC_OR_DEPARTMENT_OTHER): Payer: Self-pay

## 2011-03-31 DIAGNOSIS — Z331 Pregnant state, incidental: Secondary | ICD-10-CM | POA: Insufficient documentation

## 2011-03-31 DIAGNOSIS — L309 Dermatitis, unspecified: Secondary | ICD-10-CM

## 2011-03-31 MED ORDER — HYDROCORTISONE 2.5 % EX CREA
TOPICAL_CREAM | Freq: Two times a day (BID) | CUTANEOUS | Status: AC
Start: 2011-03-31 — End: 2012-03-30

## 2011-03-31 NOTE — Progress Notes (Signed)
SUBJECTIVE:  Denise Ryan is a 40 year old female who presents here to f/up on BP in setting of new pregnancy.       Has rash on arm adn using Clobetasol.  OK to continue.     BPs have been fine in the past--except for last week when she had + pregnancy test.    Feeing well.   Very happy with pregnancy.    ROS:   General --no major fatigue  CV-- no CP  GI--no N or V  Neuro--occ hmild h/as  MEDICATIONS:    Current outpatient prescriptions:  Prenatal MV-Min-Fe Fum-FA-DHA (PRENATAL 1) 30-0.975-200 MG CAPS Take 1 capsule by mouth daily. Disp: 1 each Rfl: 12   clobetasol (TEMOVATE) 0.05 % cream Apply  topically 2 (two) times daily. Disp: 1 tube Rfl: 12         ALLERGIES:  Review of Patient's Allergies indicates:   Norfloxacin             Hives    OBJECTIVE:      PHYSICAL EXAM:  WD, obese  Woman who appears well NAD   03/31/11  1154   BP: 112/76   Pulse: 78   Temp: 98.1 F (36.7 C)   TempSrc: Temporal   Resp: 16   Height: 5\' 4"  (1.626 m)   Weight: 183 lb 8 oz (83.235 kg)   SpO2: 100%       HEENT-- nocongestion  Neck-- supple , no masses  Cor--S1S2 reg no M  Ext-- no CCE  Skin -- pink papules R upper arm, medial surface      ASSESSMENT:   40 yo woman seeing me for the first time with recent dx of pregnancy and ? HTN    659.60G Advanced maternal age  (primary encounter diagnosis)  Comment: BP was measured as high at nursing visit a few days ago but appears to be normal today.  WIll not give any treatment today but would monitor closely.        V22.2T Normal pregnancy  Comment: On prenatal vits.  Has OB appt set in a few weeks.      692.9BU Eczema  Comment: Change from Clobetasol to hydrocortisone.  Plan: hydrocortisone 2.5 % cream                  F/up as planned with OB.        1. The patient indicates understanding of these issues and agrees with the plan.  2.  The patient is given an After Visit Summary sheet that lists all of their medications with directions, their allergies, orders placed during  this encounter, immunization dates, and follow- up instructions.  3. I reviewed the patient's medical information and medical history   4.  I reconciled the patient's medication list and prepared and supplied needed refills.  5.  I have reviewed the past medical, family, and social history sections including the medications and allergies listed in the above medical record      Electronically signed by: Shelah Lewandowsky, MD, 03/31/2011 12:33 PM  This note is electronically signed in the electronic medical record.

## 2011-04-14 ENCOUNTER — Telehealth (HOSPITAL_BASED_OUTPATIENT_CLINIC_OR_DEPARTMENT_OTHER): Payer: Self-pay | Admitting: Obstetrics & Gynecology

## 2011-04-14 NOTE — Telephone Encounter (Signed)
left vm reminding pt of appt on 5/30

## 2011-04-15 ENCOUNTER — Ambulatory Visit (HOSPITAL_BASED_OUTPATIENT_CLINIC_OR_DEPARTMENT_OTHER): Payer: No Typology Code available for payment source

## 2011-04-15 ENCOUNTER — Ambulatory Visit (HOSPITAL_BASED_OUTPATIENT_CLINIC_OR_DEPARTMENT_OTHER): Payer: No Typology Code available for payment source | Admitting: Obstetrics & Gynecology

## 2011-04-15 ENCOUNTER — Encounter (HOSPITAL_BASED_OUTPATIENT_CLINIC_OR_DEPARTMENT_OTHER): Payer: Self-pay | Admitting: Obstetrics & Gynecology

## 2011-04-15 DIAGNOSIS — Z7189 Other specified counseling: Secondary | ICD-10-CM

## 2011-04-15 DIAGNOSIS — IMO0002 Reserved for concepts with insufficient information to code with codable children: Secondary | ICD-10-CM

## 2011-04-15 NOTE — Progress Notes (Signed)
This encounter was opened in error.  Please disregard.

## 2011-04-15 NOTE — Progress Notes (Addendum)
Addended by: Army Fossa on: 04/15/2011      Modules accepted: Orders

## 2011-04-15 NOTE — Progress Notes (Signed)
Pt is covered by 'Partial H[employer]' with an annual family deductible of approximately $3,500 dollars.    SCW explained to pt that she should report the pregnancy/EDD to [facility] ASAP because the family size will increase when the baby (pregnancy) is added to the MH case and, consequently, the deductible will decrease or the type of their coverage will change to H[employer] Primary (pt's husband) or to [facility] plus [employer] (for pt).   SCW referred pt to the [facility] and gave directions to the office in [place].

## 2011-04-20 ENCOUNTER — Telehealth (HOSPITAL_BASED_OUTPATIENT_CLINIC_OR_DEPARTMENT_OTHER): Payer: Self-pay

## 2011-04-20 NOTE — Telephone Encounter (Signed)
Pt's ultrasound is on 04/22/11 @ [facility]. Left message on pt's answering machine regarding specialty appt.date.

## 2011-04-21 LAB — US PREG UTERUS REAL TIME W/IMAGE DCMTN TRANSVAG

## 2011-04-21 LAB — US OB 1ST TRIMESTER

## 2011-04-22 ENCOUNTER — Ambulatory Visit (HOSPITAL_BASED_OUTPATIENT_CLINIC_OR_DEPARTMENT_OTHER): Payer: Self-pay | Admitting: Obstetrics & Gynecology

## 2011-04-22 DIAGNOSIS — O09519 Supervision of elderly primigravida, unspecified trimester: Secondary | ICD-10-CM

## 2011-04-23 NOTE — Progress Notes (Addendum)
Quick Note:    Ultrasound c/w reported LMP.  ______

## 2011-05-03 ENCOUNTER — Telehealth (HOSPITAL_BASED_OUTPATIENT_CLINIC_OR_DEPARTMENT_OTHER): Payer: Self-pay | Admitting: Advanced Practice Midwife

## 2011-05-03 ENCOUNTER — Encounter (HOSPITAL_BASED_OUTPATIENT_CLINIC_OR_DEPARTMENT_OTHER): Payer: Self-pay | Admitting: Advanced Practice Midwife

## 2011-05-03 ENCOUNTER — Ambulatory Visit (HOSPITAL_BASED_OUTPATIENT_CLINIC_OR_DEPARTMENT_OTHER): Payer: No Typology Code available for payment source | Admitting: Advanced Practice Midwife

## 2011-05-03 ENCOUNTER — Ambulatory Visit (HOSPITAL_BASED_OUTPATIENT_CLINIC_OR_DEPARTMENT_OTHER): Payer: No Typology Code available for payment source

## 2011-05-03 VITALS — BP 118/84 | HR 92 | Temp 98.1°F | Resp 18 | Ht 64.0 in | Wt 182.0 lb

## 2011-05-03 DIAGNOSIS — Z7189 Other specified counseling: Secondary | ICD-10-CM

## 2011-05-03 DIAGNOSIS — Z111 Encounter for screening for respiratory tuberculosis: Secondary | ICD-10-CM

## 2011-05-03 DIAGNOSIS — Z8279 Family history of other congenital malformations, deformations and chromosomal abnormalities: Secondary | ICD-10-CM | POA: Insufficient documentation

## 2011-05-03 DIAGNOSIS — R0989 Other specified symptoms and signs involving the circulatory and respiratory systems: Secondary | ICD-10-CM | POA: Insufficient documentation

## 2011-05-03 DIAGNOSIS — O09519 Supervision of elderly primigravida, unspecified trimester: Secondary | ICD-10-CM

## 2011-05-03 LAB — CBC WITH PLATELET
HEMATOCRIT: 38 % (ref 36.0–48.0)
HEMOGLOBIN: 13 g/dl (ref 12.0–16.0)
MEAN CORP HGB CONC: 34.2 g/dl (ref 32.0–36.0)
MEAN CORPUSCULAR HGB: 30 pg (ref 27.0–33.0)
MEAN CORPUSCULAR VOL: 87.7 fl (ref 80.0–100.0)
MEAN PLATELET VOLUME: 8.9 fl (ref 6.4–10.8)
PLATELET COUNT: 190 10*3/uL (ref 150–400)
RBC DISTRIBUTION WIDTH: 13.6 % (ref 11.5–14.3)
RED BLOOD CELL COUNT: 4.34 M/uL — ABNORMAL LOW (ref 4.50–5.10)
WHITE BLOOD CELL COUNT: 9.7 10*3/uL (ref 4.0–10.8)

## 2011-05-03 LAB — TYPE AND SCREEN
ABO/RH INTERPRETATION: O POS
ANTIBODY SCREEN: NEGATIVE

## 2011-05-03 NOTE — Telephone Encounter (Signed)
Appointment scheduled for PPD check on 05/05/11 at 9:30 am with Hughes Better. I left a message for the patient to return my call.

## 2011-05-03 NOTE — Progress Notes (Signed)
New Prenatal Visit  Planned pregnancy. Very happy. Husband's first pregnancy also  Taking Prenatal vitamins  BP elevated this years not consistently but more than once: counseled may need close monitoring increase risk of Pre-eclampsia  Obese: Nutritionist counseling  AMA : Counseled on risk HTN DM SAB spontaneous genetic problems including aneuploidy, may require close monitoring during pregnancy  Family Hx of down syndrome: increase patient risk of fetus with down syndrome  Works in a Dance movement psychotherapist and a shop  Review testing for [employer] : See check list patient will start with ERA and considered more confirmatory testing  Has " hives" she has had since she had taken norfloxacin and that it comes and goes and has taken a number of medications  to amenerate  And also a biopsy.  hydrocortisone cream good result. Uses periodically      S:    no complaints    ROS:  Constitutional: benign  Allergy: none  Endocrine: negative  Dermatologic: negative and rash  Cardiovascular: negative  Respiratory: negative  Gastrointestinal: negative  Genitourinary: negative  Musculoskeletal: negative  Neurological: negative  Psychiatric: negative    O:    See the Prenatal Physical section of this visit to enter and/or review both the general and pelvic exam for this patient.    See the Prenatal Vitals section of this visit to enter and/or review weight, BP and other relevant pregnancy related details.      A:    Pregnancy at [redacted]w[redacted]d  EDD based on LMP and 9 wk [place]  S=D        P:     - Routine prenatal labs ordered.  HIV counseling done.   - Discussed testing: genetic testing, AFP screen, U/S in pregnancy and amniocentesis  Discussed social factors: nutrition, alcohol use and social situation   - Reviewed risks related to the environment, eating fish, raw milk, raw meat, cats, gardening and over heating.   - Warning signs reviewed: instructed to call for pain, bleeding, dysuria, increased nausea and vomiting and any other concerns.   - Reviewed usual  schedule of visits and how to call office.   - Follow up: 4 weeks.  - Schedule ERA  -Review practice CNM and MD pt prefers CNM practice. Advised may need MD consultation if BP increase. Pt understands  -PPD today  NV review DV husband at visit.  Mare Ferrari CNM

## 2011-05-04 ENCOUNTER — Telehealth (HOSPITAL_BASED_OUTPATIENT_CLINIC_OR_DEPARTMENT_OTHER): Payer: Self-pay | Admitting: Registered"

## 2011-05-04 LAB — HGB ELECTROPHORESIS
HEMOGLOBIN A1A3: 97.4 % (ref 96.0–99.5)
HEMOGLOBIN A2: 2.7 % (ref 2.0–3.5)
HEMOGLOBIN C: 0 % (ref 0–0)
HEMOGLOBIN F: 0 % (ref 0–2.0)
HEMOGLOBIN S: 0 % (ref 0–0)

## 2011-05-04 LAB — CHLAMYDIA GC NAAT
GENPROBE CHLAMYDIA: NEGATIVE
GENPROBE GC: NEGATIVE

## 2011-05-04 NOTE — Telephone Encounter (Signed)
Patient returned my call and appointment scheduled for the [facility]atal [facility] on 05/26/11 at 11:00 am with Dr.Goldman, Idalia Needle and [place].

## 2011-05-04 NOTE — Telephone Encounter (Signed)
Prenatal Nutrition Group. I left a message for the patient to return my call.

## 2011-05-05 ENCOUNTER — Telehealth (HOSPITAL_BASED_OUTPATIENT_CLINIC_OR_DEPARTMENT_OTHER): Payer: Self-pay | Admitting: Advanced Practice Midwife

## 2011-05-05 ENCOUNTER — Ambulatory Visit (HOSPITAL_BASED_OUTPATIENT_CLINIC_OR_DEPARTMENT_OTHER): Payer: No Typology Code available for payment source | Admitting: Registered Nurse

## 2011-05-05 DIAGNOSIS — Z111 Encounter for screening for respiratory tuberculosis: Secondary | ICD-10-CM

## 2011-05-05 DIAGNOSIS — O2340 Unspecified infection of urinary tract in pregnancy, unspecified trimester: Secondary | ICD-10-CM

## 2011-05-05 LAB — HEPATITIS B SURFACE ANTIGEN: HEPATITIS B SURFACE ANTIGEN: NONREACTIVE

## 2011-05-05 LAB — RUBELLA IGG ANTIBODY: RUBELLA: 406 IU/mL — ABNORMAL HIGH (ref 0–15)

## 2011-05-05 LAB — SKIN TEST TUBERCULOSIS INTRADERMAL: PPD: NEGATIVE mm

## 2011-05-05 LAB — THYROID SCREEN TSH REFLEX FT4: THYROID SCREEN TSH REFLEX FT4: 0.93 u[IU]/mL (ref 0.34–5.60)

## 2011-05-05 LAB — VARICELLA ZOSTER IGG ANTIBODY: VARICELLA ZOSTER IGG ANTIBODY: 2.6 — ABNORMAL HIGH (ref 0.0–0.8)

## 2011-05-05 LAB — URINE CULTURE/COLONY COUNT

## 2011-05-05 LAB — HEPATITIS C ANTIBODY: HEPATITIS C ANTIBODY: NEGATIVE

## 2011-05-05 LAB — TREPONEMA PALLIDUM AB IGG: TREPONEMA PALLIDUM AB IgG: NONREACTIVE

## 2011-05-05 LAB — HIV 1 AND 2 PLUS O ANTIBODY: HIV 1 AND 2 PLUS O SCREEN: NONREACTIVE

## 2011-05-05 MED ORDER — NITROFURANTOIN MACROCRYSTAL 100 MG PO CAPS
100.00 mg | ORAL_CAPSULE | Freq: Four times a day (QID) | ORAL | Status: AC
Start: 2011-05-05 — End: 2011-05-10

## 2011-05-05 NOTE — Progress Notes (Signed)
Pt in for ppd read. Planted Monday as part of routine PN visit.  Results : Neg.  To f/u with CNM as scheduled.

## 2011-05-05 NOTE — Telephone Encounter (Signed)
TC to patient via portuguese interpreter(Marcia [phone]  )  To relays below message for CNM.  Encourage to pick up RX Macrobid @ [place]  And to complete the RX.  TELEPHONE / HOME CARE ADVICE:   Drink 6-8 glasses of water daily.  Drink cranberry juice.  Avoid caffeine and alcohol.  Don't put off going to bathroom ;  Go when you feel the need to urinate .  Always wipe from front to back to avoid bacteria from rectum.  Urinate right after sex to clear out germs  Wear cotton underwear   Avoid tight-fitting underwear, pantyhose and jeans.  Avoid sexual activity until free of symptoms.  Pt verbalizes understanding and agrees with plan.

## 2011-05-05 NOTE — Telephone Encounter (Signed)
LM on VM  Please advise Denise Ryan that she has UTI. Macrobid has been eprescribe to [employer]. Advise her to increase fluids. Please review hygiene. Thank you Denise Ryan CNM

## 2011-05-06 ENCOUNTER — Telehealth (HOSPITAL_BASED_OUTPATIENT_CLINIC_OR_DEPARTMENT_OTHER): Payer: Self-pay | Admitting: Advanced Practice Midwife

## 2011-05-06 ENCOUNTER — Ambulatory Visit (HOSPITAL_BASED_OUTPATIENT_CLINIC_OR_DEPARTMENT_OTHER): Payer: Self-pay | Admitting: Obstetrics & Gynecology

## 2011-05-06 ENCOUNTER — Encounter (HOSPITAL_BASED_OUTPATIENT_CLINIC_OR_DEPARTMENT_OTHER): Payer: Self-pay | Admitting: Advanced Practice Midwife

## 2011-05-06 LAB — CYTOPATH, C/V, THIN LAYER

## 2011-05-06 NOTE — Telephone Encounter (Signed)
Spoke with pt about MFM appt at [facility]. Informed pt of time and date of appt. I will mail appt info. and directions to [facility]. OK per pt.

## 2011-05-07 LAB — HUMAN PAPILLOMAVIRUS (HPV): HUMAN PAPILLOMAVIRUS: NEGATIVE

## 2011-05-10 LAB — CYSTIC FIBROSIS DNA ANALYSIS: CYSTIC FIBROSIS DNA ANALYSIS: NEGATIVE

## 2011-05-24 NOTE — Progress Notes (Signed)
F/U coverage upgrade to [facility].

## 2011-05-25 ENCOUNTER — Telehealth (HOSPITAL_BASED_OUTPATIENT_CLINIC_OR_DEPARTMENT_OTHER): Payer: Self-pay | Admitting: Advanced Practice Midwife

## 2011-05-25 NOTE — Telephone Encounter (Signed)
Appointment reminder, prenatal group at 11:00 am. also appointment rescheduled from 08/25/11  to 08/24/11 at 10:20 am with Nita Sells. I left a message for the patient to return my call.

## 2011-05-26 ENCOUNTER — Ambulatory Visit (HOSPITAL_BASED_OUTPATIENT_CLINIC_OR_DEPARTMENT_OTHER): Payer: No Typology Code available for payment source

## 2011-05-26 ENCOUNTER — Ambulatory Visit (HOSPITAL_BASED_OUTPATIENT_CLINIC_OR_DEPARTMENT_OTHER): Payer: No Typology Code available for payment source | Admitting: Registered"

## 2011-05-26 ENCOUNTER — Telehealth (HOSPITAL_BASED_OUTPATIENT_CLINIC_OR_DEPARTMENT_OTHER): Payer: Self-pay | Admitting: Registered"

## 2011-05-26 DIAGNOSIS — R635 Abnormal weight gain: Secondary | ICD-10-CM

## 2011-05-26 NOTE — Telephone Encounter (Signed)
[facility] today. I left a message for the patient to return my call.

## 2011-05-27 ENCOUNTER — Encounter (HOSPITAL_BASED_OUTPATIENT_CLINIC_OR_DEPARTMENT_OTHER): Payer: Self-pay

## 2011-05-27 NOTE — Progress Notes (Signed)
Pt participated in Prenatal Obesity Prevention Group Visit facilitated by me and nutritionist, Paige Katzenack.  Pt reviewed her challenges in the past and we reviewed key importance of healthy eating, physical activity in pregnancy and extension to parenting, care of the expected infant.  Reviewed family behaviors, introduction of foods, effect on health.  The group lasted for 50 minutes.

## 2011-05-31 ENCOUNTER — Encounter (HOSPITAL_BASED_OUTPATIENT_CLINIC_OR_DEPARTMENT_OTHER): Payer: Self-pay | Admitting: Advanced Practice Midwife

## 2011-05-31 ENCOUNTER — Ambulatory Visit (HOSPITAL_BASED_OUTPATIENT_CLINIC_OR_DEPARTMENT_OTHER): Payer: No Typology Code available for payment source | Admitting: Advanced Practice Midwife

## 2011-05-31 DIAGNOSIS — O09529 Supervision of elderly multigravida, unspecified trimester: Secondary | ICD-10-CM

## 2011-05-31 MED ORDER — METRONIDAZOLE 500 MG PO TABS
2.0000 g | ORAL_TABLET | Freq: Once | ORAL | Status: DC
Start: 2011-05-31 — End: 2011-05-31

## 2011-05-31 NOTE — Progress Notes (Signed)
Doing well. Taken Antibiotics. No PVB No SROM. Not gaining weight eating well but appetite is less. No vomiting.AMA counseling done with MFM patient opted for [place] only TNF and 18 survey level 11 appointment already scheduled. Is traveling to [facility] for 3 days review precautions. O: S=D +FHT 2lb wt loss A: 14 overweight, wt loss P: RTC 4 wks Repeat UC today Mare Ferrari CNM

## 2011-06-01 LAB — URINE CULTURE/COLONY COUNT: URINE CULTURE/COLONY COUNT: NO GROWTH

## 2011-06-16 ENCOUNTER — Ambulatory Visit (HOSPITAL_BASED_OUTPATIENT_CLINIC_OR_DEPARTMENT_OTHER): Payer: No Typology Code available for payment source

## 2011-06-16 ENCOUNTER — Telehealth (HOSPITAL_BASED_OUTPATIENT_CLINIC_OR_DEPARTMENT_OTHER): Payer: Self-pay | Admitting: Registered"

## 2011-06-16 ENCOUNTER — Ambulatory Visit (HOSPITAL_BASED_OUTPATIENT_CLINIC_OR_DEPARTMENT_OTHER): Payer: No Typology Code available for payment source | Admitting: Registered"

## 2011-06-16 ENCOUNTER — Encounter (HOSPITAL_BASED_OUTPATIENT_CLINIC_OR_DEPARTMENT_OTHER): Payer: Self-pay

## 2011-06-16 NOTE — Telephone Encounter (Signed)
[facility] prenatal Nutrition Group today. I left a message for the patient to return my call.

## 2011-06-16 NOTE — Progress Notes (Signed)
.  Pt was part of the Prenatal Nutrition/ Obesity Group that I conduct with Paige Katzanstein, RD  Pt is not obese.  We reviewed basic issues of nutition in pregnancy and early childhood and important effect on the health of the child.  The patients shared the challenges they face in this area.

## 2011-06-21 ENCOUNTER — Telehealth (HOSPITAL_BASED_OUTPATIENT_CLINIC_OR_DEPARTMENT_OTHER): Payer: Self-pay | Admitting: Registered"

## 2011-06-21 NOTE — Telephone Encounter (Signed)
Patient agreed to rescheduled her appointment for Prenatal Nutrition Group. Appointment rescheduled to 07/21/11 at 11:00 am.

## 2011-06-25 ENCOUNTER — Telehealth (HOSPITAL_BASED_OUTPATIENT_CLINIC_OR_DEPARTMENT_OTHER): Payer: Self-pay | Admitting: Advanced Practice Midwife

## 2011-06-25 NOTE — Telephone Encounter (Signed)
Lm on vm w/appt changes. Mailed reminder

## 2011-06-28 ENCOUNTER — Ambulatory Visit (HOSPITAL_BASED_OUTPATIENT_CLINIC_OR_DEPARTMENT_OTHER): Payer: PRIVATE HEALTH INSURANCE | Admitting: Advanced Practice Midwife

## 2011-06-28 VITALS — BP 110/70 | Wt 181.2 lb

## 2011-06-28 DIAGNOSIS — O09519 Supervision of elderly primigravida, unspecified trimester: Secondary | ICD-10-CM

## 2011-06-28 NOTE — Progress Notes (Signed)
Doing well. No PVB No SROM. No FM as of yet. Review [place] small stomach will repeat US in 1-2 wks at [place]. Review AFP. Pt would like today. O: S=D +FHT +FM , 1 LB wt gain A: 18 wks AMA BP wnl P: RTC 4 wks. AFP today Mare Ferrari CNM

## 2011-06-30 LAB — ALPHA-FETOPROT TETRA PROFILE
AFP MoM VALUE: 0.98
AFP Value (EIA): 41.4 ng/mL
AFPT DIMIRIC INHIBIN A (EIA): 177.23 pg/mL
AFPT DIMIRIC INHIBIN A MoM: 1.06
DOWN SYND 2ND TRIMESTER RISK: 533
DOWN SYND MATERNAL AGE RISK: 73
GESTATIONAL AGE: 18.7 WEEKS
HCG MoM: 1.86
HCG Value: 32635 m[IU]/mL
MATERNAL AGE AT EDD: 40.7 YEARS
MATERNAL SERUM AFP SCREEN: NEGATIVE
MATERNAL WEIGHT: 181 [lb_av]
OPEN SPINA BIFIDA RISK FACTOR: 10000
T18 (By Age): 1:283 {titer}
UNCONJ ESTRIOL MoM: 1.2
UNCONJUGATED ESTRIOL VALUE: 1.69 ng/mL

## 2011-07-12 NOTE — Progress Notes (Addendum)
This encounter was opened in error.  Please disregard.

## 2011-07-12 NOTE — Progress Notes (Addendum)
Addended by: Shelah Lewandowsky on: 07/12/2011      Modules accepted: Level of Service, SmartSet

## 2011-07-21 ENCOUNTER — Ambulatory Visit (HOSPITAL_BASED_OUTPATIENT_CLINIC_OR_DEPARTMENT_OTHER): Payer: PRIVATE HEALTH INSURANCE | Admitting: Registered"

## 2011-07-21 ENCOUNTER — Encounter (HOSPITAL_BASED_OUTPATIENT_CLINIC_OR_DEPARTMENT_OTHER): Payer: Self-pay

## 2011-07-21 ENCOUNTER — Ambulatory Visit (HOSPITAL_BASED_OUTPATIENT_CLINIC_OR_DEPARTMENT_OTHER): Payer: PRIVATE HEALTH INSURANCE

## 2011-07-21 ENCOUNTER — Encounter (HOSPITAL_BASED_OUTPATIENT_CLINIC_OR_DEPARTMENT_OTHER): Payer: Self-pay | Admitting: Registered"

## 2011-07-21 DIAGNOSIS — R0989 Other specified symptoms and signs involving the circulatory and respiratory systems: Secondary | ICD-10-CM

## 2011-07-21 DIAGNOSIS — F419 Anxiety disorder, unspecified: Secondary | ICD-10-CM

## 2011-07-21 NOTE — Progress Notes (Signed)
OUTPATIENT PSYCHIATRY GROUP PROGRESS NOTE   Patient Name: Denise Ryan   Group Name: Prenatal group   Leaders: Kinnie Scales, RD   Shelah Lewandowsky, MD   Ihor Gully, PhD   Service Type: [phone] Group Psychotherapy   Length of Group: 60 minutes   Number of participants in group today: 7  Purpose of Group (choose all that apply):   Information/didactic   Skills training   Group Process:   Discussed healthy eating during pregnancy and after baby is born (infancy, childhood, family habits); and importance of family interaction during meals. Addressed anxiety related to possible baby's eating difficulties.   Material provided: Nutrition guidelines; a pamphlet about Psychological aspects of pregnancy, fetus and newborn (published by Clinical research associate in [place]).  Individual Patient Participation:   Active participant   Diagnosis (addressed by this group):   Anxiety related to pregnancy and healthy feeding  Medical Necessity of Session (how treatment is necessary to improve symptoms, functioning, or prevent worsening): Prenatal preventative   Relevant Changes in Mental Status: No.   Risk Level per Scale:   Suicide: low   Violence: low   Addiction: low   Current risk level represents increase in risk: No.   Ihor Gully, PhD

## 2011-07-21 NOTE — Progress Notes (Signed)
.    PRENATAL GROUP VISIT/NUTRITION ASSESSMENT    Beginning Time: 1100AM  End Time1200 pm:  Total Minutes: 60 minutes (nutrition content)    Denise Ryan  Is a 40 year old female who is at prenatal group nutrition visit at [facility]. Visit via [place] interpreter,     Patient is [redacted]w[redacted]d pregnant.  Pre-pregnant weight: 184   Height: 64"  Pre-pregnant weight is considered Very overweight. Her weight gain is below recommended range for pregnancy.     Patient identified her pre-pregnant weight category and the recommended healthy weight gain for the entire pregnancy.   The group discussed: Calcium food sources, Infant feeding.  Material provided: Thumbs up     Other activities: None  Other providers attending the group: MD, PHD, family planning       Dannielle Karvonen

## 2011-07-21 NOTE — Progress Notes (Signed)
.    Pt participated in Prenatal Obesity Prevention Group Visit facilitated by me and nutritionist, Albesa SeenPaige Katzenack.  Pt reviewed her challenges in the past and we reviewed key importance of healthy eating, physical activity in pregnancy and extension to parenting, care of the expected infant.  Reviewed family behaviors, introduction of foods, effect on health.  The group lasted for 50 minutes.

## 2011-07-21 NOTE — Progress Notes (Signed)
.  pt feels safe at home

## 2011-07-26 ENCOUNTER — Ambulatory Visit (HOSPITAL_BASED_OUTPATIENT_CLINIC_OR_DEPARTMENT_OTHER): Payer: No Typology Code available for payment source

## 2011-07-26 ENCOUNTER — Ambulatory Visit (HOSPITAL_BASED_OUTPATIENT_CLINIC_OR_DEPARTMENT_OTHER): Payer: PRIVATE HEALTH INSURANCE | Admitting: Advanced Practice Midwife

## 2011-07-26 NOTE — Progress Notes (Signed)
Doing well. Enjoys  Nutritionist group. Wt loss attributed to balanced diet. Good FM. Review Level 11 [place]. Disappointed was not able to confirm sex. O: S=D +FHT stable wt A: 22 wks AMA primip P: RTC 4 wks NV GTT. Mare Ferrari CNM

## 2011-08-17 ENCOUNTER — Telehealth (HOSPITAL_BASED_OUTPATIENT_CLINIC_OR_DEPARTMENT_OTHER): Payer: Self-pay

## 2011-08-17 NOTE — Telephone Encounter (Signed)
Appointment reminder.I left a message for the patient to return my call.

## 2011-08-18 ENCOUNTER — Ambulatory Visit (HOSPITAL_BASED_OUTPATIENT_CLINIC_OR_DEPARTMENT_OTHER): Payer: PRIVATE HEALTH INSURANCE | Admitting: Registered"

## 2011-08-18 ENCOUNTER — Ambulatory Visit (HOSPITAL_BASED_OUTPATIENT_CLINIC_OR_DEPARTMENT_OTHER): Payer: PRIVATE HEALTH INSURANCE

## 2011-08-18 ENCOUNTER — Telehealth (HOSPITAL_BASED_OUTPATIENT_CLINIC_OR_DEPARTMENT_OTHER): Payer: Self-pay | Admitting: Registered"

## 2011-08-18 NOTE — Telephone Encounter (Signed)
Dnka Nutrition Group today.I left a message for the patient to return my call.

## 2011-08-20 ENCOUNTER — Telehealth (HOSPITAL_BASED_OUTPATIENT_CLINIC_OR_DEPARTMENT_OTHER): Payer: Self-pay | Admitting: Advanced Practice Midwife

## 2011-08-20 NOTE — Telephone Encounter (Signed)
Left message of appt reminder with Denise Ryan on Tuesday the 9 @ 2

## 2011-08-24 ENCOUNTER — Ambulatory Visit (HOSPITAL_BASED_OUTPATIENT_CLINIC_OR_DEPARTMENT_OTHER): Payer: No Typology Code available for payment source

## 2011-08-24 ENCOUNTER — Ambulatory Visit (HOSPITAL_BASED_OUTPATIENT_CLINIC_OR_DEPARTMENT_OTHER): Payer: PRIVATE HEALTH INSURANCE | Admitting: Advanced Practice Midwife

## 2011-08-24 ENCOUNTER — Ambulatory Visit (HOSPITAL_BASED_OUTPATIENT_CLINIC_OR_DEPARTMENT_OTHER): Payer: No Typology Code available for payment source | Admitting: Advanced Practice Midwife

## 2011-08-24 ENCOUNTER — Ambulatory Visit (HOSPITAL_BASED_OUTPATIENT_CLINIC_OR_DEPARTMENT_OTHER): Payer: PRIVATE HEALTH INSURANCE

## 2011-08-24 VITALS — BP 120/60 | Wt 186.0 lb

## 2011-08-24 DIAGNOSIS — Z7189 Other specified counseling: Secondary | ICD-10-CM

## 2011-08-24 DIAGNOSIS — O09529 Supervision of elderly multigravida, unspecified trimester: Secondary | ICD-10-CM

## 2011-08-24 DIAGNOSIS — Z23 Encounter for immunization: Secondary | ICD-10-CM

## 2011-08-24 LAB — CBC WITH PLATELET
HEMATOCRIT: 35.7 % (ref 34.1–44.9)
HEMOGLOBIN: 11.6 g/dL (ref 11.2–15.7)
MEAN CORP HGB CONC: 32.5 g/dL (ref 31.0–37.0)
MEAN CORPUSCULAR HGB: 29.7 pg (ref 26.0–34.0)
MEAN CORPUSCULAR VOL: 91.3 fL (ref 80.0–100.0)
MEAN PLATELET VOLUME: 9.9 fL (ref 8.7–12.5)
PLATELET COUNT: 190 10*3/uL (ref 150–400)
RBC DISTRIBUTION WIDTH STD DEV: 47.9 fL — ABNORMAL HIGH (ref 35.1–46.3)
RBC DISTRIBUTION WIDTH: 14.4 % — ABNORMAL HIGH (ref 11.5–14.3)
RED BLOOD CELL COUNT: 3.91 M/uL (ref 3.90–5.20)
WHITE BLOOD CELL COUNT: 11.3 10*3/uL — ABNORMAL HIGH (ref 4.0–11.0)

## 2011-08-24 LAB — GLUCOSE 1 HR POST 50 GRAM DOSE: GLUCOSE 1 HR POST 50 GRAM DOSE: 91 mg/dl (ref ?–139)

## 2011-08-24 NOTE — Progress Notes (Signed)
08/24/2011  VIS given prior to administration and reviewed with the patient and or legal guardian. Patient understands the disease and the vaccine. See immunization/Injection module or chart review for date of publication and additional information.  Dineen Kid, RN

## 2011-08-24 NOTE — Progress Notes (Signed)
Doing well. Foot pain in arch of foot. CBE, Review VAccines Flu and Tdap accepts third trimester labs today O: S=D +FHT A: 26 P: Advise foot wear with stronger support. CBE FU in 4 wks Mare Ferrari CNM

## 2011-08-24 NOTE — Progress Notes (Signed)
PRENATAL PSYCHOSOCIAL ASSESSMENT    Intake Date: October 10th, 2012    Obstetric History   G1  P0  T0  P0  TAB0  SAB0  E0  M0  L0      EDD:11/24/2011    PRIMARY LANGUAGE: [place]  EDUCATION LEVEL: High-School grad, in [place].  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  WORK HISTORY: Pt used to work as Theme park manager, in [place].  YEARS IN [place]: 15 years, pt came from [place] with her family (father, mother, siblings).  ENGLISH: Yes, only basic english, pt can communicate.     ETHNICITY: [place] (came from [place] in [facility]; was [place], [place]   READ & WRITE: Yes, only primary language.  MARITAL STATUS: Married (2 years) and living with husband.    HEALTH INSURANCE INFORMATION: [employer] plus [employer]    MOTHER'S INFORMATION    FEELINGS ABOUT PRESENT PREGNANCY: Planned pregnancy, pt is very happy.    ANTICIPATION OF PROBLEMS/ISSUES: No issues presented today.    SUPPORT SYSTEMS: Pt only has two brothers in the [place] area, the rest of her family has      already returned to [place].  Pt also has relatives and friends she met through her store.    PREVIOUS PREGNANCY HX: None    HX MEDICAL/MENTAL HEALTH/ILLNESS:  Hx of depression in 2004, received mental      health services at [facility] (therapy with Napoleon Form and psych medication).  SCW explained      the postpartum depression and its symptoms.        HX OF SUBSTANCE USE (CIGARETTE, ETOH, OTHER):       CIG: no      ETOH: Yes, only socially, stopped with pregnancy.      DRUGS: no    INFORMATION ON FATHER OF BABY    IS BABY'S FATHER WITH YOU? Yes, waiting at the clinic waiting-room.    NAME:  Marcelle Smiling                AGE: 40         WORK STATUS: Works as Arboriculturist.    DO YOU WANT TO INVOLVE FATHER WITH PRENATAL CARE? Yes    HX OF SUBSTANCE USE (CIGARETTE, ETOH, OTHER):       CIG: No      ETOH: Yes, only occasionally.      DRUGS: No    HX OF  DOMESTIC VIOLENCE (EMOTIONAL/PHYSICAL): Pt denies domestic violence.    INFORMATION ON OTHER CHILDREN: N/A    BASIC NEEDS AND RESOURCES    WIC: Pt is already enrolled in the program.    Transportation to the Clinic: Husband drives.    PRESENT LIVING SITUATION (INCLUDING NUMBER OF PEOPLE IN HOUSEHOLD): Pt's brother    lives together.  ANTICIPATE NEEDING TO MOVE PRIOR TO DELIVERY (OR SHORTLY AFTER BIRTH): No    intention to move out. A friend will check if apartment is lead-free.  WHO WILL HELP YOU AT HOME AFTER THE BIRTH OF YOUR CHILD? Pt's mother will come    from [place] to help pt take care of the newborn. One of pt's cousin has also offered help with the    baby.  HAVE YOU GIVEN ANY THOUGHT TO HOW YOU ARE GOING TO FEED THE BABY? Pt intends    to breast-feed the  baby.

## 2011-09-18 ENCOUNTER — Ambulatory Visit (HOSPITAL_BASED_OUTPATIENT_CLINIC_OR_DEPARTMENT_OTHER): Admit: 2011-09-18 | Discharge: 2011-09-18 | Disposition: A | Discharge status: Home / Self Care | Payer: Self-pay

## 2011-09-20 ENCOUNTER — Ambulatory Visit (HOSPITAL_BASED_OUTPATIENT_CLINIC_OR_DEPARTMENT_OTHER): Payer: PRIVATE HEALTH INSURANCE

## 2011-09-20 ENCOUNTER — Encounter (HOSPITAL_BASED_OUTPATIENT_CLINIC_OR_DEPARTMENT_OTHER): Payer: Self-pay

## 2011-09-20 ENCOUNTER — Ambulatory Visit (HOSPITAL_BASED_OUTPATIENT_CLINIC_OR_DEPARTMENT_OTHER): Payer: PRIVATE HEALTH INSURANCE | Admitting: Advanced Practice Midwife

## 2011-09-20 VITALS — BP 120/78 | Wt 187.0 lb

## 2011-09-20 DIAGNOSIS — Z3009 Encounter for other general counseling and advice on contraception: Secondary | ICD-10-CM

## 2011-09-20 DIAGNOSIS — O09513 Supervision of elderly primigravida, third trimester: Secondary | ICD-10-CM

## 2011-09-20 NOTE — Progress Notes (Signed)
[place] 3D done. Doing well. Appetite diminished recently. Foot pain improved. Needs to r/s CBE . Review labs wnl; Review doula desires sent requestO: S=D +FHT TWG 5lbs BMI 31 presentation uncertain. A: AMA 30 wks. P: Review with patient > 40 years management recommendation wkly NST at 36 wks Consider IOL 39-40 wks.   NV M&I  Mare Ferrari CNM

## 2011-09-20 NOTE — Progress Notes (Signed)
.  Subjective: Patient is here to discuss birth control methods.    History: [place], female, 40 yo    No chief complaint on file.        Current outpatient prescriptions:  hydrocortisone 2.5 % cream Apply  topically 2 (two) times daily. Disp: 30 g Rfl: 3   Prenatal MV-Min-Fe Fum-FA-DHA (PRENATAL 1) 30-0.975-200 MG CAPS Take 1 capsule by mouth daily. Disp: 1 each Rfl: 12   clobetasol (TEMOVATE) 0.05 % cream Apply  topically 2 (two) times daily. Disp: 1 tube Rfl: 12         Allergies: Norfloxacin    Substance Use: no    Alcohol Use: Yes   Comment: socially          Drug Use: No         Smoking status: Former Smoker         LMP 02/17/2011.    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 She is pregnant    Satisfaction with Method: no    Contraceptive History: OCP    Date of last SA: one week ago    Protection used: no    UCG required: no    EC need: no    Condom Use: Never    Condom Breakage: no    Condom instruction done: yes    HX of STDs: no  Tested Before: no  Component      Latest Ref Rng 05/03/2011   GENPROBE GC       Negative for Neisseria gonorrhoeae rRNA.   GENPROBE CHLAMYDIA       Negative for Chlamydia trachomatis rRNA.   HUMAN PAPILLOMAVIRUS       Negative for High Risk DNA Probe . . .   Component      Latest Ref Rng 05/03/2011   HEPATITIS B SURFACE AG SCR      Low: NONREACTIVE NON-REACTIVE   HIV 1 AND 2 PLUS O SCREEN      Low: NONREACTIVE NON-REACTIVE     Testing Offered: yes  Testing Accepted: no    Specific reason for testing:    -Disc:   Transmission (Blood, Semen, Vaginal Fluid and Breastmilk), Risky Behaviors, Non-Risks/Myths,  Seroconversion Period , Result Waiting Time, Possible Results, Feelings Surrounding Possible Results, Plans for Informing of Results, Support Structures and Hep C Risk.     Risk Assessment Completed:  yes  Piercing/Tattoo: no  Known Partner Risks: no  IV Drug Use: no    Risk Reduction Planning Completed   Readiness to test:   Still want to test?   no    GYN:  Last GYN exam (date/result): 04/23/11 with negative result    Hx of Abnormal PAP: No    Objective: To discuss birth control    Pregnancy Test Result: not performed    Education/Counseling and Referrals:  Contraceptive overview: Risks, Benefits, Warning Signs, Effectiveness, Side Effects, Instructions, Follow Up  Informed Consent signed for:  Family Planning  STD/HIV Risk Assessment Done  STD/HIV prevention/safer sex discussed  Condoms offered and  Declined  Emergency Contraception Discussed  Confidentiality  Handouts given    Follow Up Plan:   Patient came here to discuss birth control. She is 06 months pregnant. She is 40 years old and she is planning to have another baby after this one. This is her first pregnancy.    Discussed all birth control, side effects and effectiveness. Patient did not want to decide for any method today. She is thinking to use condoms in the beginning.    Instructed her to return to the Clinic in case she has any questions or change her mind about start any method. She agreed.    General Family Planning consent form signed.    Bobbe Medico - FPC.

## 2011-09-21 ENCOUNTER — Encounter (HOSPITAL_BASED_OUTPATIENT_CLINIC_OR_DEPARTMENT_OTHER): Payer: Self-pay

## 2011-09-22 ENCOUNTER — Ambulatory Visit (HOSPITAL_BASED_OUTPATIENT_CLINIC_OR_DEPARTMENT_OTHER): Payer: PRIVATE HEALTH INSURANCE

## 2011-09-22 ENCOUNTER — Ambulatory Visit (HOSPITAL_BASED_OUTPATIENT_CLINIC_OR_DEPARTMENT_OTHER): Payer: PRIVATE HEALTH INSURANCE | Admitting: Registered"

## 2011-09-28 ENCOUNTER — Ambulatory Visit (HOSPITAL_BASED_OUTPATIENT_CLINIC_OR_DEPARTMENT_OTHER): Payer: Self-pay | Admitting: Obstetrics & Gynecology

## 2011-10-04 ENCOUNTER — Encounter (HOSPITAL_BASED_OUTPATIENT_CLINIC_OR_DEPARTMENT_OTHER): Payer: Self-pay | Admitting: Advanced Practice Midwife

## 2011-10-04 ENCOUNTER — Ambulatory Visit (HOSPITAL_BASED_OUTPATIENT_CLINIC_OR_DEPARTMENT_OTHER): Payer: PRIVATE HEALTH INSURANCE | Admitting: Advanced Practice Midwife

## 2011-10-04 VITALS — BP 110/70 | Wt 187.0 lb

## 2011-10-04 DIAGNOSIS — O09519 Supervision of elderly primigravida, unspecified trimester: Secondary | ICD-10-CM

## 2011-10-04 NOTE — Progress Notes (Signed)
Doing well No complaint.No CBE class nx very close to due date. General review phases of labor. Pain management. Pt wants epidural. Has a friend that will be with her in labor. Advise to do the Maternity Tour. O: S=D +FHT A: 32 wks P: RTC 2 wks Review [facility] and when to call midwife  XX M&I next visit   Mare Ferrari CNM

## 2011-10-18 ENCOUNTER — Telehealth (HOSPITAL_BASED_OUTPATIENT_CLINIC_OR_DEPARTMENT_OTHER): Payer: Self-pay | Admitting: Registered"

## 2011-10-18 ENCOUNTER — Ambulatory Visit (HOSPITAL_BASED_OUTPATIENT_CLINIC_OR_DEPARTMENT_OTHER): Payer: PRIVATE HEALTH INSURANCE | Admitting: Advanced Practice Midwife

## 2011-10-18 VITALS — BP 110/70 | Wt 190.0 lb

## 2011-10-18 DIAGNOSIS — O09519 Supervision of elderly primigravida, unspecified trimester: Secondary | ICD-10-CM

## 2011-10-18 NOTE — Progress Notes (Signed)
Signed consent. Doing well. No complaints. No PVB No SROM. Good FM. O: S=D +FHT presentation cephalic some doubt 2ndary to body habitus. A: 34 wks ? Presentation P: RTC 2 wks [place] Start AMA testing NV. Mare Ferrari CNM

## 2011-10-18 NOTE — Telephone Encounter (Signed)
Patient canceled her prenatal Nutrition appointment.

## 2011-10-27 ENCOUNTER — Ambulatory Visit (HOSPITAL_BASED_OUTPATIENT_CLINIC_OR_DEPARTMENT_OTHER): Payer: PRIVATE HEALTH INSURANCE | Admitting: Registered"

## 2011-10-27 ENCOUNTER — Ambulatory Visit (HOSPITAL_BASED_OUTPATIENT_CLINIC_OR_DEPARTMENT_OTHER): Payer: PRIVATE HEALTH INSURANCE

## 2011-10-29 ENCOUNTER — Ambulatory Visit (HOSPITAL_BASED_OUTPATIENT_CLINIC_OR_DEPARTMENT_OTHER): Payer: Self-pay | Admitting: Advanced Practice Midwife

## 2011-10-29 LAB — US OB FOLLOW UP

## 2011-11-01 ENCOUNTER — Telehealth (HOSPITAL_BASED_OUTPATIENT_CLINIC_OR_DEPARTMENT_OTHER): Payer: Self-pay | Admitting: Advanced Practice Midwife

## 2011-11-01 ENCOUNTER — Other Ambulatory Visit (HOSPITAL_BASED_OUTPATIENT_CLINIC_OR_DEPARTMENT_OTHER): Payer: Self-pay | Admitting: Advanced Practice Midwife

## 2011-11-01 DIAGNOSIS — O09519 Supervision of elderly primigravida, unspecified trimester: Secondary | ICD-10-CM

## 2011-11-01 NOTE — Telephone Encounter (Signed)
Message copied by Simonne Martinet on Mon Nov 01, 2011  9:39 AM  ------       Message from: Murvin Donning       Created: Mon Nov 01, 2011  9:27 AM       Regarding: reschedule an apppointment       Contact: [phone]         [facility]              Margerie Fraiser [id number], 40 year old, female, Telephone Information:       Home Phone      [phone]       Work Phone      Not on file.       Mobile          [phone]                      Call back #  [phone]                            Patient called to resched her appt with [facility] Dragon today 11/01/11.              Thanks

## 2011-11-01 NOTE — Telephone Encounter (Signed)
Appointment scheduled on 11/03/11 at 9:30 am for NST at [facility]) also pn f/up with Mare Ferrari at [facility] 11:00 am. Patient agreed.

## 2011-11-01 NOTE — Telephone Encounter (Signed)
Patient is unable to come in today, problem with ride. Appointment rescheduled to 11/03/11 at 11:00 am with Mare Ferrari.

## 2011-11-03 ENCOUNTER — Encounter (HOSPITAL_BASED_OUTPATIENT_CLINIC_OR_DEPARTMENT_OTHER): Payer: Self-pay | Admitting: Advanced Practice Midwife

## 2011-11-03 ENCOUNTER — Ambulatory Visit (HOSPITAL_BASED_OUTPATIENT_CLINIC_OR_DEPARTMENT_OTHER): Payer: PRIVATE HEALTH INSURANCE | Admitting: Advanced Practice Midwife

## 2011-11-03 ENCOUNTER — Ambulatory Visit (HOSPITAL_BASED_OUTPATIENT_CLINIC_OR_DEPARTMENT_OTHER): Payer: PRIVATE HEALTH INSURANCE | Admitting: Obstetrics & Gynecology

## 2011-11-03 VITALS — BP 117/68

## 2011-11-03 VITALS — BP 120/64 | Wt 193.0 lb

## 2011-11-03 DIAGNOSIS — O0990 Supervision of high risk pregnancy, unspecified, unspecified trimester: Secondary | ICD-10-CM

## 2011-11-03 DIAGNOSIS — O09519 Supervision of elderly primigravida, unspecified trimester: Secondary | ICD-10-CM

## 2011-11-03 NOTE — Progress Notes (Signed)
Denise Ryan, a 40 year old G1P0 at [redacted]w[redacted]d, who presents to the clinic for a non-stress test. Pregnancy is complicated by Supervision of high risk pregnancy [V23.9], advanced maternal age    NST obtained and given to Dr  Mindi Junker for review.

## 2011-11-03 NOTE — Progress Notes (Signed)
Doing well. Some cramping periodically No PVB No SROM Good FM. Review IOL for AMA Will do what is recommended 39-40 wks US wnl borderline AFI cephalic O: S=D EFW 6lbs A: 37 wks P: RTC wkly wkly NSTS [employer] CNM

## 2011-11-06 LAB — GENITAL GROUP B STREP: GENITAL GROUP B STREP: NEGATIVE

## 2011-11-08 ENCOUNTER — Ambulatory Visit (HOSPITAL_BASED_OUTPATIENT_CLINIC_OR_DEPARTMENT_OTHER): Payer: PRIVATE HEALTH INSURANCE | Admitting: Advanced Practice Midwife

## 2011-11-08 VITALS — BP 120/78 | Wt 193.5 lb

## 2011-11-08 DIAGNOSIS — O09519 Supervision of elderly primigravida, unspecified trimester: Secondary | ICD-10-CM

## 2011-11-08 NOTE — Progress Notes (Signed)
Doing well. Some back ache Good FM O; S=D+FHT cephalic A: 37wks 5 days. P: RTC 1 wk NST on Wednesday. Cervical exam next visit Mare Ferrari CNM

## 2011-11-10 ENCOUNTER — Ambulatory Visit (HOSPITAL_BASED_OUTPATIENT_CLINIC_OR_DEPARTMENT_OTHER): Payer: PRIVATE HEALTH INSURANCE | Admitting: Advanced Practice Midwife

## 2011-11-10 VITALS — BP 120/71

## 2011-11-10 DIAGNOSIS — O0990 Supervision of high risk pregnancy, unspecified, unspecified trimester: Secondary | ICD-10-CM

## 2011-11-10 NOTE — Progress Notes (Signed)
Denise Ryan, a 40 year old G1P0 at [redacted]w[redacted]d, who presents to the clinic for a non-stress test. Pregnancy is complicated by Supervision of high risk pregnancy [V23.9]    NST obtained and given to Lowella Dandy CNM for review.

## 2011-11-15 ENCOUNTER — Ambulatory Visit (HOSPITAL_BASED_OUTPATIENT_CLINIC_OR_DEPARTMENT_OTHER): Payer: PRIVATE HEALTH INSURANCE | Admitting: Advanced Practice Midwife

## 2011-11-15 ENCOUNTER — Telehealth (HOSPITAL_BASED_OUTPATIENT_CLINIC_OR_DEPARTMENT_OTHER): Payer: Self-pay | Admitting: Registered Nurse

## 2011-11-15 VITALS — BP 120/78 | Wt 196.0 lb

## 2011-11-15 DIAGNOSIS — O09519 Supervision of elderly primigravida, unspecified trimester: Secondary | ICD-10-CM

## 2011-11-15 DIAGNOSIS — N979 Female infertility, unspecified: Secondary | ICD-10-CM | POA: Insufficient documentation

## 2011-11-15 MED ORDER — PRENATAL 1 30-0.975-200 MG PO CAPS
1.0000 | ORAL_CAPSULE | Freq: Every day | ORAL | Status: DC
Start: 2011-11-15 — End: 2011-11-15

## 2011-11-15 MED ORDER — PRENATAL 1 30-0.975-200 MG PO CAPS
1.0000 | ORAL_CAPSULE | Freq: Every day | ORAL | Status: AC
Start: 2011-11-15 — End: 2012-11-15

## 2011-11-15 NOTE — Progress Notes (Signed)
Cough resolving but now has rib pain with cough. right and left left costal edge not to palpation. NO PVB no SROM. Good FM. NST for Wednesday. Review IOL. Schedule 39-40 wks. Opted for Friday January 11/19/2011. 8pm. Advised floor census could have to r/s. Review when to call the midwife. Strip membranes today pt accepts O: S=D +FHT SVE 1 cm/50/-3 posterior attempted stripped membranes A: 38 wk AMA > 40 yo P: Friday night.8 pm L&D Cervical ripening. Review [facility] and when to call midwife in labor Mare Ferrari CNM

## 2011-11-15 NOTE — Telephone Encounter (Signed)
Marylou:    Pls resend with correct quant.  Tx

## 2011-11-15 NOTE — Telephone Encounter (Signed)
Message copied by Olevia Bowens on Mon Nov 15, 2011 10:57 AM  ------       Message from: Felecia Shelling       Created: Mon Nov 15, 2011 10:53 AM       Regarding: pharmacy       Contact: [phone]                Call back #              Interpreter:none              Person Calling:[place] out patient pharmacy              Reason for todays call?: Marylou prescribed patient medication today and is missing the quantity. Thanks

## 2011-11-16 NOTE — L&D Delivery Note (Signed)
Delivery Note for Denise Ryan [place]: [redacted]w[redacted]d    Labor Events:    Rupture date: Rupture Date: 11/21/11   Rupture time: Rupture Time: 1128   Rupture type: Artificial   Fluid Color: Clear  Other (comment)  Meconium   Cervical ripening:         Induction: Cervidil  Oxytocin  AROM  Misoprostol   Augmentation:    Labor Complications: Chorioamnionitis  Fetal Tachycardia  Meconium In Amniotic Fluid   Delivery:    Episiotomy: Left Mediolateral   Lacerations:    Repair suture:    Sharp Count: Correct   Sponge Count: Correct   Blood loss (ml): 300   Anesthesia: Local<BR>  Epidural     Analgesics:      Information for the patient's newborn:  Elzora, [place] Girl [[id number]]     Delivery  11/21/2011 5:29 PM by  Vacuum assisted vaginal delivery  Sex:  Female  Delivery Clinician:  Windell Norfolk  Other providers: Registered Nurse Marquerite Marcy Panning  Maureen Mcswiggin     Living?: Yes          APGARS  One minute Five minutes Ten minutes   Skin color: 1   1       Heart rate: 2   2       Grimace: 1   2       Muscle tone: 1   1       Breathing: 2   2       Totals: 7  8        Presentation/position: Vertex  Right Occiput Anterior  Resuscitation: Suctioning  Dry/Stim   Cord information: 3 Vessels   Disposition of cord blood:     Blood gases sent? Yes  Complications: Nuchal   Placenta: Delivered: 11/21/2011   Spontaneous   appearance  Newborn Measurements:  Weight: 6 lb 10.2 oz (3.011 kg)     Additional  information:  Observed anomalies              CTSP of CNM 2/2 prolonged fetal tachycardia.  FH in 180's X >1 hour and variability now minimal.   Vtx @ +2  Kiwi vacuum applied. 3 pulls at pressure of 567mm/hg with final pull popping off.  With these pulls, vtx brought to crowning. Left mediolateral episiotomy performed without difficulty and immediate delivery of head, nuchal x 1 reduced easily and rest of body delivered. Cord clamped and cut and baby handed to anesthesia attending and pedi  resident and pedi nurse. Cord gas sent. Placenta delivered spontaneously.  2nd degree lac repaired without difficulty with 3-0 vicryl.  Mgoldfarb, md

## 2011-11-17 ENCOUNTER — Encounter (HOSPITAL_BASED_OUTPATIENT_CLINIC_OR_DEPARTMENT_OTHER): Payer: Self-pay | Admitting: Advanced Practice Midwife

## 2011-11-17 ENCOUNTER — Ambulatory Visit (HOSPITAL_BASED_OUTPATIENT_CLINIC_OR_DEPARTMENT_OTHER): Payer: PRIVATE HEALTH INSURANCE | Admitting: Advanced Practice Midwife

## 2011-11-17 VITALS — BP 131/87

## 2011-11-17 DIAGNOSIS — O0990 Supervision of high risk pregnancy, unspecified, unspecified trimester: Secondary | ICD-10-CM

## 2011-11-17 NOTE — Progress Notes (Signed)
Denise Ryan, a 41 year old G1P0 at [redacted]w[redacted]d, who presents to the clinic for a non-stress test. Pregnancy is complicated by Supervision of high risk pregnancy [V23.9]    NST obtained and given to Lowella Dandy CNM for review.

## 2011-11-19 ENCOUNTER — Inpatient Hospital Stay (HOSPITAL_BASED_OUTPATIENT_CLINIC_OR_DEPARTMENT_OTHER)
Admit: 2011-11-19 | Disposition: A | Discharge status: Home / Self Care | Payer: Self-pay | Source: Ambulatory Visit | Attending: Obstetrics & Gynecology | Admitting: Obstetrics & Gynecology

## 2011-11-19 ENCOUNTER — Encounter (HOSPITAL_BASED_OUTPATIENT_CLINIC_OR_DEPARTMENT_OTHER): Payer: Self-pay | Admitting: Obstetrics & Gynecology

## 2011-11-19 LAB — CBC WITH PLATELET
HEMATOCRIT: 36.8 % (ref 34.1–44.9)
HEMOGLOBIN: 12.2 g/dL (ref 11.2–15.7)
MEAN CORP HGB CONC: 33.2 g/dL (ref 31.0–37.0)
MEAN CORPUSCULAR HGB: 30 pg (ref 26.0–34.0)
MEAN CORPUSCULAR VOL: 90.6 fL (ref 80.0–100.0)
MEAN PLATELET VOLUME: 10.1 fL (ref 8.7–12.5)
PLATELET COUNT: 158 10*3/uL (ref 150–400)
RBC DISTRIBUTION WIDTH STD DEV: 45.9 fL (ref 35.1–46.3)
RBC DISTRIBUTION WIDTH: 13.9 % (ref 11.5–14.3)
RED BLOOD CELL COUNT: 4.06 M/uL (ref 3.90–5.20)
WHITE BLOOD CELL COUNT: 11.1 10*3/uL — ABNORMAL HIGH (ref 4.0–11.0)

## 2011-11-19 LAB — URINALYSIS
BILIRUBIN, URINE: NEGATIVE
GLUCOSE, URINE: NEGATIVE MG/DL
KETONE, URINE: NEGATIVE MG/DL
LEUKOCYTE ESTERASE: NEGATIVE
NITRITE, URINE: NEGATIVE
OCCULT BLOOD, URINE: NEGATIVE
PH URINE: 7 (ref 5.0–8.0)
PROTEIN, URINE: NEGATIVE MG/DL
SPECIFIC GRAVITY URINE: 1.01 (ref 1.003–1.035)

## 2011-11-19 LAB — CHG ASSAY OF BLOOD/URIC ACID: URIC ACID: 3.8 mg/dl (ref 2.6–8.0)

## 2011-11-19 LAB — CHG CREATININE BLOOD: CREATININE: 0.7 mg/dl (ref 0.4–1.2)

## 2011-11-19 LAB — TYPE AND SCREEN
ABO/RH INTERPRETATION: O POS
ANTIBODY SCREEN: NEGATIVE

## 2011-11-19 LAB — ALANINE AMINOTRANSFERASE: ALANINE AMINOTRANSFERASE: 22 IU/L (ref 7–35)

## 2011-11-19 LAB — ASPARTATE AMINOTRANSFERASE: ASPARTATE AMINOTRANSFERASE: 21 IU/L (ref 8–34)

## 2011-11-19 MED ORDER — METHYLERGONOVINE MALEATE 0.2 MG/ML IJ SOLN
200.0000 ug | INTRAMUSCULAR | Status: DC | PRN
Start: 2011-11-19 — End: 2011-11-23

## 2011-11-19 MED ORDER — LACTATED RINGERS IV BOLUS
1000.00 mL | Freq: Once | INTRAVENOUS | Status: AC
Start: 2011-11-19 — End: 2011-11-19
  Administered 2011-11-19: 1000 mL via INTRAVENOUS

## 2011-11-19 MED ORDER — MISOPROSTOL 200 MCG PO TABS
800.0000 ug | ORAL_TABLET | ORAL | Status: DC | PRN
Start: 2011-11-19 — End: 2011-11-23
  Filled 2011-11-19: qty 4

## 2011-11-19 MED ORDER — ZOLPIDEM TARTRATE 5 MG PO TABS
10.00 mg | ORAL_TABLET | Freq: Once | ORAL | Status: AC
Start: 2011-11-19 — End: 2011-11-20

## 2011-11-19 MED ORDER — LIDOCAINE HCL (PF) 2 % IJ SOLN
20.0000 mL | INTRAMUSCULAR | Status: DC | PRN
Start: 2011-11-19 — End: 2011-11-22
  Filled 2011-11-19: qty 20

## 2011-11-19 MED ORDER — CARBOPROST TROMETHAMINE 250 MCG/ML IM SOLN
250.0000 ug | INTRAMUSCULAR | Status: DC | PRN
Start: 2011-11-19 — End: 2011-11-23

## 2011-11-19 MED ORDER — MINERAL OIL LIGHT EX OIL
TOPICAL_OIL | CUTANEOUS | Status: DC | PRN
Start: 2011-11-19 — End: 2011-11-22
  Filled 2011-11-19: qty 25

## 2011-11-19 MED ORDER — LACTATED RINGERS IV SOLN
INTRAVENOUS | Status: DC
Start: 2011-11-19 — End: 2011-11-20
  Administered 2011-11-19: 22:00:00 via INTRAVENOUS

## 2011-11-19 MED ORDER — OXYTOCIN 10 UNIT/ML IJ SOLN
10.0000 [IU] | INTRAMUSCULAR | Status: DC | PRN
Start: 2011-11-19 — End: 2011-11-23

## 2011-11-19 MED ORDER — DINOPROSTONE 10 MG VA INST
10.0000 mg | VAGINAL_INSERT | Freq: Once | VAGINAL | Status: AC | PRN
Start: 2011-11-19 — End: 2011-11-19
  Administered 2011-11-19: 10 mg via VAGINAL
  Filled 2011-11-19: qty 1

## 2011-11-19 MED ORDER — TERBUTALINE SULFATE 1 MG/ML IJ SOLN
0.2500 mg | INTRAMUSCULAR | Status: DC | PRN
Start: 2011-11-19 — End: 2011-11-22
  Filled 2011-11-19: qty 1

## 2011-11-19 MED ORDER — OXYTOCIN 30 UNITS IN LR 500 ML BOLUS
500.00 mL | Status: AC | PRN
Start: 2011-11-19 — End: 2011-11-21
  Administered 2011-11-21: 500 mL via INTRAVENOUS
  Filled 2011-11-19: qty 500

## 2011-11-19 NOTE — Progress Notes (Signed)
CNM Note:  Patient is resting on side  FHT 150's moderate variability; occasional mild contraction with late deceleration, no pattern  Plan:  IV bolus 1000 cc; continue LR @125  ml/hour  Maternal O2  Position changes as indicated  Defer cervidil until EFM tracing  Patient reviewed in Board Rounds - may consider pitocin for CST/IOL if unable to use ripener  Lynne Logan, CNM

## 2011-11-19 NOTE — Progress Notes (Signed)
CNM Note;  vss afebrile  FHT tracing improved variability with maternal 02/positioning, IV fluids  SVE 10:50pm cx posterior/FT/vx ballotable  Cervidil #1 placed  Continuous EFM  Observation  Lynne Logan, CNM

## 2011-11-19 NOTE — H&P (Signed)
Chief Complaint/HPI:   Denise Ryan is a 41 year old G1P0 with Estimated Date of Delivery:  11/24/2011, by Last Menstrual Period at [redacted]w[redacted]d who is presenting with  Scheduled Procedure        Patient reports presentation to labor and delivery for cervical ripening.      Loss of fluid: no   Fetal Movement: normal  Vaginal Bleeding:absence.   Patient Active Problem List:     GERD (Gastroesophageal Reflux Disease) [530.81K]     Allergic Rhinitis due to Other Allergen [477.8]     Pes planus [734L]     Normal pregnancy [V22.2T]     Advanced maternal age [659.60G]     Labile blood pressure [796.2AT]     Family history of Down syndrome [V19.5R]     Supervision of high-risk pregnancy of elderly primigravida [V23.81]     Infertility, female [628.8M]      OB History    Grav Para Term Preterm Abortions TAB SAB Ect Mult Living    1                # Outc Date GA Lbr Len/2nd Wgt Sex Del Anes PTL Lv    1 CUR                     Prescriptions prior to admission:  Prenatal MV-Min-Fe Fum-FA-DHA (PRENATAL 1) 30-0.975-200 MG CAPS Take 1 capsule by mouth daily. Disp: 30 each Rfl: 12   hydrocortisone 2.5 % cream Apply  topically 2 (two) times daily. Disp: 30 g Rfl: 3         Review of Patient's Allergies indicates:   Norfloxacin             Hives      Past Medical History    Other advanced maternal age in pregnancy     HTN (hypertension)     Comment: Labile Blood pressures no medications    Down's syndrome     Comment: Cousin : Mother 5            Past Surgical History    TONSILLECTOMY 1/2 AGE 45/> age 78          Family History    Allergies Mother     Psychiatric Illness Mother     Comment: depression    Dermatology Father     Comment: leprosy     GI Maternal Grandmother     Comment: uterine cancer        Social History    Marital Status: Married             Spouse Name:                       Years of Education:                 Number of children:               Social History Main Topics    Smoking Status: Former Smoker                    Packs/Day:       Years:         Smokeless Status: Never Used                        Comment: 1 cig /week     Alcohol Use: Yes  Comment: socially     Drug Use: No              Sexual Activity: Yes               Partners with: Female       Birth Control/Protection: Pill       Comment: Diane 35 from [place]     Other Topics            Concern  Financial planner        No  Blood Transfusions      No  Caffeine Concern        No  Occupational Exposure   No  Hobby Hazards           No  Sleep Concern           No  Stress Concern          No  Weight Concern          No  Special Diet            No  Back Care               No  Exercise                No  Bike Helmet             No  Seat Belt               No  Self-Exams              No    Social History Narrative    01/09/10 Married, no children.  Works in [employer].  H[employer]    MARITAL STATUS:single, no children ,  To  [place] 11 years from [place]     OCCUPATION: store owner , also work at [employer] WITH; with boy friend     SUPPORT;good     FUNCTIONAL STATUS; independent         09/20/11    Patient is from [place]. Lives with her husband. 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 She is 07 months pregnant. This is her first pregnancy.                            Physical Exam:     Patient Vital Ranges in the past 24 hours       General:   alert, appears stated age and cooperative   Skin:   normal   HEENT:  PERRLA   Lungs:   clear to auscultation bilaterally   Heart:   S1, S2 normal, no murmur, click, rub or gallop, regular rate and rhythm, no hepatosplenomegaly   Breasts:   Deferred   Abdomen:  soft, non-tender; bowel sounds normal; no masses,  no organomegaly     Bishop Score:         Cervix:  Dilation: 1  Effacement: 50  Station: -2  SSE: N/A    Membranes: Intact.   Presentation: cephalic by Ultra Sound, exam and ultrasound    Fetal Assessment   Baseline 150's, variables to 140's, reassess after IV bolus  Variability moderate   Accelerations:  present  Decelerations: Variable  TOCO: Contractions every 0 minutes   Impression: Reactive    Lab Review:   No results found for this or any previous visit (from the past 72 hour(s)).  Assessment & Plan:   Denise Ryan is aG1P0 with Estimated Date of Delivery:  11/24/2011, by Last Menstrual Period who is being admitted for Induction of labor for [facility] @ 39 weeks.    Pregnancy is complicated by AMA  Fetal Heart Rate ZO:[id number] , Category II  GBS is Maternal GBS negative    Plan: Admit to [facility]P for cervical ripening           Type and Screen/CBC/Baseline PIH labs           Cervidil planned           IV bolus           Lynne Logan, CNM

## 2011-11-20 ENCOUNTER — Other Ambulatory Visit (HOSPITAL_BASED_OUTPATIENT_CLINIC_OR_DEPARTMENT_OTHER): Payer: Self-pay | Admitting: Advanced Practice Midwife

## 2011-11-20 DIAGNOSIS — O09519 Supervision of elderly primigravida, unspecified trimester: Secondary | ICD-10-CM

## 2011-11-20 MED ORDER — MORPHINE SULFATE 10 MG/ML IJ SOLN
15.00 mg | Freq: Once | INTRAMUSCULAR | Status: AC
Start: 2011-11-20 — End: 2011-11-20
  Administered 2011-11-20: 15 mg via INTRAMUSCULAR
  Filled 2011-11-20: qty 2

## 2011-11-20 MED ORDER — MISOPROSTOL 25 MCG PO TABS
50.00 ug | ORAL_TABLET | ORAL | Status: AC
Start: 2011-11-20 — End: 2011-11-21
  Administered 2011-11-20 – 2011-11-21 (×3): 50 ug via ORAL
  Filled 2011-11-20 (×3): qty 2

## 2011-11-20 MED ORDER — HYDROXYZINE HCL 50 MG/ML IM SOLN
25.00 mg | Freq: Once | INTRAMUSCULAR | Status: AC
Start: 2011-11-20 — End: 2011-11-20
  Administered 2011-11-20: 25 mg via INTRAMUSCULAR
  Filled 2011-11-20: qty 1

## 2011-11-20 NOTE — Progress Notes (Signed)
CNM Note:  vss afebrile  FHT 145-150 improved variability  Category 1  Irregular contractions, mild  Hep lock to IV   Declined Ambien -resting without medication  Lynne Logan, CNM

## 2011-11-20 NOTE — Progress Notes (Signed)
S: patient feeling cramping  O: vss  Just put back on monitor  VE 1-2/70/-3v by u/s this AM  A: primip induction for AMA  P: check fetal tracing  Continue oral miso if appropriate  L.Olaf Mesa CNM

## 2011-11-20 NOTE — Progress Notes (Signed)
Subjective  :   Not feeling ctx    Objective:   Temp:  [98.4 F (36.9 C)-99.5 F (37.5 C)] 99.5 F (37.5 C)  Pulse:  [61-78] 78   BP: (109-133)/(63-81) 118/74 mmHg      Intake/Output Summary (Last 24 hours) at 11/20/11 1113  Last data filed at 11/20/11 1010   Gross per 24 hour   Intake 1572.92 ml   Output   2650 ml   Net -1077.08 ml       Cervical Exam:  Dilation: 1  Effacement: 75  Station: -3    FHR assessment:   Baseline: 130  Variability: moderate   Accelerations: present  Decelerations: None  Category: I  TOCO: Contractions every 3-6 minutes   Assessment:   Denise Ryan is a 41 year old G1P0 female with Estimated Date of Delivery:  11/24/2011, by Last Menstrual Period at [redacted]w[redacted]d   Pregnancy is Complicated by AMA.  FHR is category: I    Plan:   CERVIDIL #1 REMOVED.  1 hour on FHM, then shower, lunch and next agent.  Explained to patient and FOB. They express understanding.  Mgoldfarb, md

## 2011-11-20 NOTE — Progress Notes (Signed)
CNM Note:  Patient resting well, feels she can sleep more. Feeling occasional contractions, irregular.  Due for Cervidil #2 @ 10:50am  SVE: deferred  Category 1 tracing; baseline 150's +accels  Luis Abed assuming 8am care  Stable, no SROM  Lynne Logan, CNM

## 2011-11-20 NOTE — Progress Notes (Signed)
Pt had cervidil removed by Dr. Ellwood Handler, pt states not feeling contractions.  Smiling with husband who is supportive.

## 2011-11-20 NOTE — Progress Notes (Signed)
Subjective  :   Pt feeling contractions. Stronger than menses cramp.     Objective:   Temp:  [98.4 F (36.9 C)-99.5 F (37.5 C)] 98.7 F (37.1 C)  Pulse:  [61-78] 77   BP: (109-133)/(63-81) 130/80 mmHg      Intake/Output Summary (Last 24 hours) at 11/20/11 2012  Last data filed at 11/20/11 1900   Gross per 24 hour   Intake 1572.92 ml   Output   3950 ml   Net -2377.08 ml       Cervical Exam:  Deferred exam 1 hour ago see note    FHR assessment:   Baseline: 130   Variability: moderate   Accelerations: present  Decelerations: None  Category: I  TOCO: Contractions every 4-7 minutes   Assessment:   Denise Ryan is a 41 year old G1P0 female with Estimated Date of Delivery:  11/24/2011, by Last Menstrual Period at [redacted]w[redacted]d   Pregnancy is Complicated by AMA.  FHR is category: I    Plan:   Discuss with patient option in early latent phase.   Morphine sulfate effects   Pt does plan on anesthesia when more active  Accepts Morphine sulfate  Mare Ferrari CNM

## 2011-11-20 NOTE — Progress Notes (Signed)
Feeling more confortable  Sleepy  O: FHR 125BTBV moderated no decel + acels       Contraction mild irregular       SVE deferred     #3 misoprostol given orally 50 mcg  A: IOL AMA 39 wks  P: Monitor per protocol  Mare Ferrari CNM

## 2011-11-20 NOTE — Progress Notes (Signed)
Pt off the monitor to take a shower and eat lunch.

## 2011-11-21 DIAGNOSIS — IMO0002 Reserved for concepts with insufficient information to code with codable children: Secondary | ICD-10-CM

## 2011-11-21 DIAGNOSIS — O411290 Chorioamnionitis, unspecified trimester, not applicable or unspecified: Secondary | ICD-10-CM

## 2011-11-21 LAB — ARTERIAL CORD BLOOD GAS
A-CORD BASE EXCESS: -7 mmol/L — ABNORMAL LOW (ref ?–3)
A-CORD BICARBONATE: 19.8 mmol/L (ref 13.30–27.50)
A-CORD PARTIAL CARBON DIOXIDE: 48 (ref 31.10–74.30)
A-CORD PARTIAL PRESSURE OXYGEN: 15 mmHG (ref 3.80–33.80)
A-CORD PATIENTS TEMP: 101.2
ARTERIAL CORD PH: 7.23 (ref 7.15–7.43)

## 2011-11-21 MED ORDER — ACETAMINOPHEN 325 MG PO TABS
975.00 mg | ORAL_TABLET | Freq: Once | ORAL | Status: AC
Start: 2011-11-21 — End: 2011-11-21
  Administered 2011-11-21: 975 mg via ORAL
  Filled 2011-11-21: qty 3

## 2011-11-21 MED ORDER — CLINDAMYCIN PHOSPHATE IN D5W 900 MG/50ML IV SOLN
900.0000 mg | Freq: Three times a day (TID) | INTRAVENOUS | Status: DC
Start: 2011-11-21 — End: 2011-11-22
  Administered 2011-11-21: 900 mg via INTRAVENOUS
  Filled 2011-11-21: qty 50

## 2011-11-21 MED ORDER — LACTATED RINGERS IV SOLN
INTRAVENOUS | Status: AC
Start: 2011-11-21 — End: 2011-11-22
  Administered 2011-11-21: 19:00:00 via INTRAVENOUS

## 2011-11-21 MED ORDER — ACETAMINOPHEN 650 MG PR SUPP
975.00 mg | Freq: Once | RECTAL | Status: AC
Start: 2011-11-21 — End: 2011-11-21
  Administered 2011-11-21: 975 mg via RECTAL

## 2011-11-21 MED ORDER — SODIUM CHLORIDE 0.9 % MINI-BAG PLUS
2.0000 g | Freq: Four times a day (QID) | INTRAVENOUS | Status: DC
Start: 2011-11-21 — End: 2011-11-22
  Administered 2011-11-21: 2 g via INTRAVENOUS
  Filled 2011-11-21: qty 2

## 2011-11-21 MED ORDER — OXYTOCIN 30 UNITS IN LR 500 ML
0.0000 m[IU]/min | Status: DC | PRN
Start: 2011-11-21 — End: 2011-11-22
  Administered 2011-11-21: 8 m[IU]/min via INTRAVENOUS
  Administered 2011-11-21: 7 m[IU]/min via INTRAVENOUS
  Administered 2011-11-21: 1 m[IU]/min via INTRAVENOUS
  Administered 2011-11-21: 5 m[IU]/min via INTRAVENOUS
  Filled 2011-11-21: qty 500

## 2011-11-21 MED ORDER — LACTATED RINGERS IV BOLUS
500.00 mL | Freq: Once | INTRAVENOUS | Status: AC
Start: 2011-11-21 — End: 2011-11-21
  Administered 2011-11-21: 500 mL via INTRAVENOUS

## 2011-11-21 MED ORDER — GENTAMICIN IN SALINE 1.6-0.9 MG/ML-% IV SOLN
80.0000 mg | Freq: Three times a day (TID) | INTRAVENOUS | Status: DC
Start: 2011-11-21 — End: 2011-11-22

## 2011-11-21 MED ORDER — GENTAMICIN IN SALINE 1-0.9 MG/ML-% IV SOLN
100.00 mg | Freq: Once | INTRAVENOUS | Status: AC
Start: 2011-11-21 — End: 2011-11-21
  Administered 2011-11-21: 100 mg via INTRAVENOUS
  Filled 2011-11-21: qty 100

## 2011-11-21 MED ORDER — LACTATED RINGERS IV BOLUS
1000.00 mL | Freq: Once | INTRAVENOUS | Status: AC
Start: 2011-11-21 — End: 2011-11-21
  Administered 2011-11-21: 1000 mL via INTRAVENOUS

## 2011-11-21 MED ORDER — IBUPROFEN 400 MG PO TABS
800.0000 mg | ORAL_TABLET | Freq: Four times a day (QID) | ORAL | Status: DC | PRN
Start: 2011-11-21 — End: 2011-11-23
  Administered 2011-11-22 (×2): 800 mg via ORAL
  Filled 2011-11-21 (×3): qty 2

## 2011-11-21 MED ORDER — LACTATED RINGERS IV BOLUS
1000.0000 mL | Freq: Once | INTRAVENOUS | Status: DC
Start: 2011-11-21 — End: 2011-11-21

## 2011-11-21 MED ORDER — LACTATED RINGERS IV SOLN
INTRAVENOUS | Status: DC
Start: 2011-11-21 — End: 2011-11-22
  Administered 2011-11-21: 05:00:00 via INTRAVENOUS

## 2011-11-21 MED ORDER — FENTANYL CITRATE 0.05 MG/ML IJ SOLN
INTRAMUSCULAR | Status: AC
Start: 2011-11-21 — End: 2011-11-21
  Filled 2011-11-21: qty 4

## 2011-11-21 NOTE — Progress Notes (Signed)
Pt now febrile and fetal tachycardia, ordered now ampicillin and gentamycin with rectal tylenol.    Reviewed I+O and is behind with fluids.  1000 cc LR ordered now.   Cont with close monitoring and anticipate SVE.  Wynonia Hazard, CNM

## 2011-11-21 NOTE — Progress Notes (Signed)
Due to fetal tachycardia, and maternal chorio with slow progression, MD Goldfarb attended birth- see delivery note.  At this time pt and infant stable and baby girl with vigorous cry.    Wynonia Hazard, CNM

## 2011-11-21 NOTE — Progress Notes (Signed)
Pt preparing to transfer to PP.  A&O, no c/o.  Baby has been in nursery for evaluation d/t chorio during labor.  Mat temp now 100.  C/w Dr Neil Crouch; abx have been dc'ed; cont abx not needed.  Denise Ryan

## 2011-11-21 NOTE — Progress Notes (Signed)
Urine culture sent now.   Wynonia Hazard, CNM

## 2011-11-21 NOTE — Progress Notes (Signed)
Subjective  :   Confortable    Objective:   Temp:  [98.3 F (36.8 C)-99.2 F (37.3 C)] 98.3 F (36.8 C)  Pulse:  [60-78] 61   BP: (97-153)/(58-88) 109/67 mmHg      Intake/Output Summary (Last 24 hours) at 11/21/11 0718  Last data filed at 11/21/11 1610   Gross per 24 hour   Intake 1627.3 ml   Output   2700 ml   Net -1072.7 ml       Cervical Exam:  Deferred    FHR assessment:   Baseline: 130  Variability: moderate   Accelerations: present  Decelerations: Early  Category: I  TOCO: Contractions every 3-5 minutes     PItocin on 3 milliunits  Assessment:   Denise Ryan is a 41 year old G1P0 female with Estimated Date of Delivery:  11/24/2011, by Last Menstrual Period at [redacted]w[redacted]d   Pregnancy is Complicated by AMA. Obesity.  FHR is category: I    Plan:   Continue with augmentation  Monitor progress  Mare Ferrari CNM

## 2011-11-21 NOTE — Progress Notes (Signed)
Pushing now with CNM and RN in room, peds and delivery team aware.  Pt doing well.  Wynonia Hazard, CNM

## 2011-11-21 NOTE — H&P (Signed)
Pre-Anesthetic Note    Pre op Diagnosis: IUP    Planned Procedure: L+D    Patient ID:  Denise Ryan is a 41 year old female presenting for induction of labor          Previous Anesthetic History:     Past Surgical History    TONSILLECTOMY 1/2 AGE 61/> age 55        Patient denies complications of anesthesia.  Patient denies family complications of anesthesia.    Current Medications:      No current facility-administered medications on file prior to encounter.  Current outpatient prescriptions ordered prior to encounter:  Prenatal MV-Min-Fe Fum-FA-DHA (PRENATAL 1) 30-0.975-200 MG CAPS Take 1 capsule by mouth daily. Disp: 30 each Rfl: 12   hydrocortisone 2.5 % cream Apply  topically 2 (two) times daily. Disp: 30 g Rfl: 3         Hospital Meds       . lactated ringers  1,000 mL Intravenous Once   . lactated ringers  500 mL Intravenous Once   . misoprostol  50 mcg Oral Q4H SCH   . morphine  15 mg Intramuscular Once   . hydrOXYzine  25 mg Intramuscular Once   . zolpidem  10 mg Oral Once       PRN Meds       oxytocin 0-20 milli-units/min Continuous PRN   terbutaline 0.25 mg See Admin Instructions   mineral oil light  See Admin Instructions   lidocaine 20 mL See Admin Instructions   oxytocin 10 Units See Admin Instructions   methylergonovine 200 mcg See Admin Instructions   carboprost 250 mcg Q15 Min PRN   misoprostol 800 mcg See Admin Instructions   oxytocin 500 mL See Admin Instructions             Allergies:   Review of Patient's Allergies indicates:   Norfloxacin             Hives    Smoking, Alcohol, Drugs:     Smoking status: Former Smoker     Smokeless tobacco:     Comment:     Alcohol Use: Yes    Comment: socially        Drug Use: No         PMHx:    Past Medical History    Other advanced maternal age in pregnancy     HTN (hypertension)     Comment: Labile Blood pressures no medications    Down's syndrome     Comment: Cousin : Mother 64          PHYSICAL EXAMINATION:  BP 102/64  Pulse 61  Temp 98.3 F (36.8  C)  SpO2 99%  LMP 02/17/2011  Breastfeeding? No    General: NAD    Airway Classification:  Mallampati: Class I     A soft palate, fauces, uvula, anterior and posterior tonsil pillars are seen.   Mallampati Airway Classification:   Oral Opening:  3cm  Full range of neck Motion:  Yes     Lungs: clear to auscultation bilaterally    Heart: normal and regular rate and rhythm    EKG/Echo: None    Chest X-Ray: None    Pertinent Labs:     Component Value Date   NA 137 12/19/2009   K 4.4 12/19/2009   CREAT 0.7 11/19/2011   GLUCOSER 84 12/19/2009   WBC 11.1* 11/19/2011   HCT 36.8 11/19/2011   PLTA 158 11/19/2011  Other Findings: None    ASA Assessment: I A Normal Healthy Patient  Emergency:  No   Potential Anesthesia Problems:  No    Plan:  Labor Epidural with Spinal/GA backup for C/S.    Aalok V. Denese Killings, MD   Pager: [phone]          Marialy Martinez = Mandatory Field.  For non-mandatory fields, the provider will enter relevant positive findings.

## 2011-11-21 NOTE — Progress Notes (Signed)
In to see CNM patient 2/2 fetal tachycardia.  Patient with chorioamnionitis with temp to 101 F.  FH 170-180 min-moderate variability. +early decels. Category 2  Patient pushing well with vtx @ +2  Will add Clindamycin to broaden coverage.   Continue pushing.  Frequent re-assess. Consider instrument delivery.  Mgoldfarb, md

## 2011-11-21 NOTE — Progress Notes (Signed)
Subjective  :   Pt resting with epidural, sister at bedside.  Reviewed plan with MD Goldfard and RN Meg.  AT 0830 examined pt and although not 15x 15 beat accel, variability present with scalp stim and no  Decerations.      Objective:   Temp:  [98.3 F (36.8 C)-99.2 F (37.3 C)] 99 F (37.2 C)  Pulse:  [60-78] 62   Resp:  [22] 22   BP: (97-153)/(58-88) 113/65 mmHg      Intake/Output Summary (Last 24 hours) at 11/21/11 0901  Last data filed at 11/21/11 0840   Gross per 24 hour   Intake 1908.83 ml   Output   2850 ml   Net -941.17 ml       Cervical Exam:  Dilation: 5  Effacement: 90  Station: -3    FHR assessment:   Baseline: 130  Variability: moderate   Accelerations: absent  Decelerations: Early  Category: I  TOCO: Contractions every 3-5 minutes   Assessment:   Denise Ryan is a 41 year old G1P0 female with Estimated Date of Delivery:  11/24/2011, by Last Menstrual Period at [redacted]w[redacted]d   Pregnancy is Complicated by IOL for AMA and term.  FHR is category: I    Plan:   Cont with IOL, pitocin IOL now. Continuous montitoring.  Will reevaluate at approx 1030 am.    Wynonia Hazard, CNM

## 2011-11-21 NOTE — Progress Notes (Signed)
Subjective  :   Feeling contraction pain  Wants something    Objective:   Temp:  [98.3 F (36.8 C)-99.5 F (37.5 C)] 98.3 F (36.8 C)  Pulse:  [60-78] 60   BP: (110-130)/(58-80) 112/58 mmHg      Intake/Output Summary (Last 24 hours) at 11/21/11 0358  Last data filed at 11/20/11 1900   Gross per 24 hour   Intake      0 ml   Output   2300 ml   Net  -2300 ml       Cervical Exam:  Dilation: 3  Effacement: 80  Station: -3    FHR assessment:   Baseline: 130  Variability: moderate   Accelerations: present  Decelerations: None  Category: I  TOCO: Contractions every 3 minutes   Assessment:   Denise Ryan is a 41 year old G1P0 female with Estimated Date of Delivery:  11/24/2011, by Last Menstrual Period at [redacted]w[redacted]d   Pregnancy is Complicated by AMA.  FHR is category: I    Plan:   Discuss with patient more Analgesia or Anesthesia via Epidural. Pt wants Epidural.   Evaluate after epidural for Pitocin. Presently contracting q 3 minutes  Mare Ferrari CNM

## 2011-11-21 NOTE — Progress Notes (Signed)
Pt now comfortable after epidural bolus.  Contractions now every 2-4 minutes.  Mild variable noted, category 2 tracing.  Cont to monitor.     Wynonia Hazard, CNM

## 2011-11-21 NOTE — Progress Notes (Signed)
Subjective  :   Pt crying and feeling lots of contractions at this time.  Requesting epidural bolus and anesthesia currently at bedside.      Objective:   Temp:  [98.3 F (36.8 C)-99.5 F (37.5 C)] 99.5 F (37.5 C)  Pulse:  [58-102] 102   Resp:  [20-22] 20   BP: (97-162)/(56-93) 131/56 mmHg      Intake/Output Summary (Last 24 hours) at 11/21/11 1234  Last data filed at 11/21/11 1000   Gross per 24 hour   Intake 2085.03 ml   Output   2500 ml   Net -414.97 ml       Cervical Exam:  Dilation: 8  Effacement: 90  Station: -2    FHR assessment:   Baseline: 135  Variability: moderate   Accelerations: present  Decelerations: Early and Variable  Category: II  TOCO: Contractions every 3 minutes   Assessment:   Denise Ryan is a 41 year old G1P0 female with Estimated Date of Delivery:  11/24/2011, by Last Menstrual Period at [redacted]w[redacted]d   Pregnancy is Complicated by AMA, IOL.  FHR is category: II    Plan:   Plan to get pt comfortable and cont with pitocin IOL, cont fetal monitoring, update to MD Goldfarb.   Wynonia Hazard, CNM

## 2011-11-21 NOTE — Progress Notes (Signed)
Called in to see patient with Deceleration  FHR in the 80s 02 on changing position s/p epidural placement  To hands and knees slow resolution BP slightly decrease from patient norm  SVE 3/80/-3 BBOW  A: decel x 7 minutes resolved  P: Observe       Pt need pitocin augmentation  Mare Ferrari CNM

## 2011-11-22 ENCOUNTER — Ambulatory Visit (HOSPITAL_BASED_OUTPATIENT_CLINIC_OR_DEPARTMENT_OTHER): Payer: No Typology Code available for payment source | Admitting: Advanced Practice Midwife

## 2011-11-22 ENCOUNTER — Encounter (HOSPITAL_BASED_OUTPATIENT_CLINIC_OR_DEPARTMENT_OTHER): Payer: Self-pay | Admitting: Obstetrics & Gynecology

## 2011-11-22 LAB — CBC, PLATELET & DIFFERENTIAL
ABSOLUTE BASO COUNT: 0 10*3/uL (ref 0.0–0.1)
ABSOLUTE EOSINOPHIL COUNT: 0.1 10*3/uL (ref 0.0–0.8)
ABSOLUTE IMM GRAN COUNT: 0.1 10*3/uL — ABNORMAL HIGH (ref 0.00–0.03)
ABSOLUTE LYMPH COUNT: 3.1 10*3/uL (ref 0.6–5.9)
ABSOLUTE MONO COUNT: 1.8 10*3/uL — ABNORMAL HIGH (ref 0.2–1.4)
ABSOLUTE NEUTROPHIL COUNT: 16.1 10*3/uL — ABNORMAL HIGH (ref 1.6–8.3)
BASOPHIL %: 0.1 % (ref 0.0–1.2)
EOSINOPHIL %: 0.4 % (ref 0.0–7.0)
HEMATOCRIT: 32 % — ABNORMAL LOW (ref 34.1–44.9)
HEMOGLOBIN: 10.8 g/dL — ABNORMAL LOW (ref 11.2–15.7)
IMMATURE GRANULOCYTE %: 0.5 % — ABNORMAL HIGH (ref 0.0–0.4)
LYMPHOCYTE %: 14.8 % — ABNORMAL LOW (ref 15.0–54.0)
MEAN CORP HGB CONC: 33.8 g/dL (ref 31.0–37.0)
MEAN CORPUSCULAR HGB: 30.8 pg (ref 26.0–34.0)
MEAN CORPUSCULAR VOL: 91.2 fL (ref 80.0–100.0)
MEAN PLATELET VOLUME: 10.3 fL (ref 8.7–12.5)
MONOCYTE %: 8.4 % (ref 4.0–13.0)
NEUTROPHIL %: 75.8 % — ABNORMAL HIGH (ref 40.0–75.0)
PLATELET COUNT: 135 10*3/uL — ABNORMAL LOW (ref 150–400)
RBC DISTRIBUTION WIDTH STD DEV: 46.4 fL — ABNORMAL HIGH (ref 35.1–46.3)
RBC DISTRIBUTION WIDTH: 14.1 % (ref 11.5–14.3)
RED BLOOD CELL COUNT: 3.51 M/uL — ABNORMAL LOW (ref 3.90–5.20)
WHITE BLOOD CELL COUNT: 21.3 10*3/uL — ABNORMAL HIGH (ref 4.0–11.0)

## 2011-11-22 MED ORDER — DOCUSATE SODIUM 100 MG PO CAPS
100.00 mg | ORAL_CAPSULE | Freq: Two times a day (BID) | ORAL | Status: AC
Start: 2011-11-22 — End: 2012-01-20

## 2011-11-22 MED ORDER — PRENATAL 27-0.8 MG PO TABS
1.0000 | ORAL_TABLET | Freq: Every day | ORAL | Status: DC
Start: 2011-11-22 — End: 2013-07-18

## 2011-11-22 MED ORDER — OXYCODONE-ACETAMINOPHEN 5-325 MG PO TABS
2.0000 | ORAL_TABLET | ORAL | Status: DC | PRN
Start: 2011-11-22 — End: 2011-11-23

## 2011-11-22 MED ORDER — PRENATAL 27-0.8 MG PO TABS
1.0000 | ORAL_TABLET | Freq: Every day | ORAL | Status: DC
Start: 2011-11-22 — End: 2011-11-23
  Administered 2011-11-22 – 2011-11-23 (×2): 1 via ORAL
  Filled 2011-11-22 (×2): qty 1

## 2011-11-22 MED ORDER — BISACODYL 10 MG PR SUPP
10.0000 mg | Freq: Every day | RECTAL | Status: DC | PRN
Start: 2011-11-22 — End: 2011-11-23

## 2011-11-22 MED ORDER — FERROUS SULFATE 325 (65 FE) MG PO TABS
325.00 mg | ORAL_TABLET | Freq: Every day | ORAL | Status: AC
Start: 2011-11-22 — End: 2012-05-21

## 2011-11-22 MED ORDER — OXYCODONE-ACETAMINOPHEN 5-325 MG PO TABS
1.0000 | ORAL_TABLET | ORAL | Status: DC | PRN
Start: 2011-11-22 — End: 2011-11-23

## 2011-11-22 MED ORDER — LACTATED RINGERS IV SOLN
INTRAVENOUS | Status: AC
Start: 2011-11-22 — End: 2011-11-23

## 2011-11-22 MED ORDER — BISACODYL 5 MG PO TBEC
10.0000 mg | DELAYED_RELEASE_TABLET | Freq: Every day | ORAL | Status: DC | PRN
Start: 2011-11-22 — End: 2011-11-23

## 2011-11-22 MED ORDER — IBUPROFEN 400 MG PO TABS
400.0000 mg | ORAL_TABLET | ORAL | Status: DC | PRN
Start: 2011-11-22 — End: 2011-11-23

## 2011-11-22 MED ORDER — SENNOSIDES 8.6 MG PO TABS
2.0000 | ORAL_TABLET | Freq: Every day | ORAL | Status: AC | PRN
Start: 2011-11-22 — End: 2011-11-23
  Administered 2011-11-23: 17.2 mg via ORAL
  Filled 2011-11-22: qty 2

## 2011-11-22 MED ORDER — IBUPROFEN 800 MG PO TABS
800.00 mg | ORAL_TABLET | Freq: Four times a day (QID) | ORAL | Status: AC | PRN
Start: 2011-11-22 — End: 2012-01-20

## 2011-11-22 MED ORDER — DOCUSATE SODIUM 100 MG PO CAPS
100.0000 mg | ORAL_CAPSULE | Freq: Two times a day (BID) | ORAL | Status: DC
Start: 2011-11-22 — End: 2011-11-23
  Administered 2011-11-22 – 2011-11-23 (×3): 100 mg via ORAL
  Filled 2011-11-22 (×3): qty 1

## 2011-11-22 MED ORDER — WITCH HAZEL-GLYCERIN EX PADS
MEDICATED_PAD | CUTANEOUS | Status: DC
Start: 2011-11-22 — End: 2011-11-23
  Administered 2011-11-22: 23:00:00 via TOPICAL
  Filled 2011-11-22: qty 40

## 2011-11-22 MED ORDER — BENZOCAINE 20 % MT SOLN
OROMUCOSAL | Status: DC
Start: 2011-11-22 — End: 2011-11-23

## 2011-11-22 NOTE — Progress Notes (Signed)
Subjective:     Postpartum Day 1 s/p Vacuum assisted vaginal delivery   Gestational Age: [redacted]w[redacted]d    Obstetric History   G1  P1  T1  P0  TAB0  SAB0  E0  M0  L1       Pain is well controlled with current medications. The baby is well. Baby is feeding via breast.  The patient is  ambulating well. The patient is  tolerating a house diet. The patient feels well. The patient denies emotional concerns.    Calf Tenderness:  No  Lochia: Moderate    Objective:   Patient Vital Ranges in the past 24 hours  Temp:  [98.3 F (36.8 C)-101.7 F (38.7 C)] 98.9 F (37.2 C)  Pulse:  [74-101] 96   Resp:  [20-22] 20   BP: (0-150)/(0-77) 115/57 mmHg      Intake/Output Summary (Last 24 hours) at 11/22/11 1252  Last data filed at 11/22/11 0918   Gross per 24 hour   Intake 1902.88 ml   Output    650 ml   Net 1252.88 ml       I/O last 3 completed shifts:  In: 2626.6 [P.O.:100; I.V.:1276.6; IV Piggyback:250]  Out: 1400 [Urine:1400]    General:    alert, cooperative and no distress   Uterine fundus:  firm at U -2   Extremities:  No evidence of DVT seen on physical exam.       Component Value Date   WBC 21.3* 11/22/2011   HGB 10.8* 11/22/2011   HCT 32.0* 11/22/2011   PLTA 135* 11/22/2011       Blood Type:   O POS       Component Value Date   ANTISCN NEGATIVE 11/19/2011         Immunization History  Administered            Date(s) Administered    Flu Vaccine > 3 Yrs   08/24/2011      Flu vaccine,split Virus, 18 & >                          08/18/2009      PPD                   05/03/2011      Td                    10/28/2010      Tdap                  08/24/2011      Assessment:   41 year old, G1P1001 , status post Vacuum assisted vaginal delivery  Gestational Age: [redacted]w[redacted]d.   Doing well    Patient Active Problem List:     GERD (Gastroesophageal Reflux Disease) [530.81K]     Allergic Rhinitis due to Other Allergen [477.8]     Pes planus [734L]     Normal pregnancy [V22.2T]     Advanced maternal age [659.60G]     Labile blood pressure [796.2AT]     Family  history of Down syndrome [V19.5R]     Supervision of high-risk pregnancy of elderly primigravida [V23.81]     Infertility, female [628.45M]       Plan:  Continue current care.  Contraception :  unsure  Circumcision Plan: No  Anticipate D/C in am    Immunization status: TdaP UTD, confirmed    Nita Sells, CNM

## 2011-11-22 NOTE — Progress Notes (Signed)
Subjective:     Postpartum Day 1 s/p Vacuum assisted vaginal delivery   Gestational Age: [redacted]w[redacted]d    Obstetric History   G1  P1  T1  P0  TAB0  SAB0  E0  M0  L1       Pain is well controlled with current medications. The baby is well. Baby is feeding via breast.  The patient is  ambulating well. The patient is  tolerating a house diet. The patient feels well. The patient denies emotional concerns.    Lochia: Light    Objective:   Patient Vital Ranges in the past 24 hours  Temp:  [98.2 F (36.8 C)-98.9 F (37.2 C)] 98.2 F (36.8 C)  Pulse:  [76-96] 78   Resp:  [20] 20   BP: (115-128)/(57-64) 121/64 mmHg      Intake/Output Summary (Last 24 hours) at 11/22/11 2123  Last data filed at 11/22/11 0918   Gross per 24 hour   Intake      0 ml   Output    300 ml   Net   -300 ml       I/O last 3 completed shifts:  In: 1636.9 [P.O.:100; I.V.:286.9; IV Piggyback:250]  Out: 650 [Urine:350; Blood:300]    General:    alert, appears stated age and cooperative           Abdomen:  softly distended and non tender       Uterine fundus:  firm at U -1   Incision:  N/A   Extremities:  no edema              Perineum:   Healing well    Component Value Date   WBC 21.3* 11/22/2011   HGB 10.8* 11/22/2011   HCT 32.0* 11/22/2011   PLTA 135* 11/22/2011       Blood Type:   O POS       Component Value Date   ANTISCN NEGATIVE 11/19/2011         Immunization History  Administered            Date(s) Administered    Flu Vaccine > 3 Yrs   08/24/2011      Flu vaccine,split Virus, 18 & >                          08/18/2009      PPD                   05/03/2011      Td                    10/28/2010      Tdap                  08/24/2011      Assessment:   41 year old, G1P1001 , status post Vacuum assisted vaginal delivery  Gestational Age: [redacted]w[redacted]d.   Doing well    Patient Active Problem List:     GERD (Gastroesophageal Reflux Disease) [530.81K]     Allergic Rhinitis due to Other Allergen [477.8]     Pes planus [734L]     Normal pregnancy [V22.2T]     Advanced maternal age  [659.60G]     Labile blood pressure [796.2AT]     Family history of Down syndrome [V19.5R]     Supervision of high-risk pregnancy of elderly primigravida [V23.81]     Infertility, female [628.36M]  Plan:  Discharge home with standard precautions and return to clinic in 4-6 weeks.  Contraception :  Micronor  Circumcision Plan: N/A    Immunization status: TdaP UTD, confirmed  Reviewed s/s PPD with patient and her partner. Pt reports understanding. Knows she can access CNM number on call if s/s PPD or any other questions or concerns arise.  PP teaching done. Pt plans to go home in the morning.  Shacoria Latif CNM

## 2011-11-22 NOTE — Discharge Summary (Signed)
Delivery Information:     Date of Admission:  11/19/2011          Date of Discharge:11/23/2011  Delivery Location:  [facility]  Delivery Provider:  Windell Norfolk    Date of Delivery:   11/21/2011    Time of Delivery:  5:29 PM    Type of Delivery:  Vacuum assisted vaginal delivery  Gestational Age:  [redacted]w[redacted]d   Anesthesia:  Local<BR>  Epidural    Antepartum/Intrapartum Complications:  AMA, h/o labile blood pressures.    Placenta: Delivered on 11/21/2011  Time: 5:34 PM  Method Spontaneous  Appearance: Intact    Perineum: Episiotomy Left Mediolateral, laceration   Delivery or Postpartum Complications: Vacuum assisted vaginal delivery for fetal tachycardia    Estimated Blood Loss: 300      Infant Information:   Sex:  Female     6 lb 10.2 oz (3.011 kg)      APGAR 1 minute: 7  APGAR 5 minutes: 8      Circumcised:  N/A  Infant Complications:  No    Feeding Issues:  No    Maternal Discharge Medications:   for every medication:             Miyoshi, Ligas   Home Medication Instructions [id number]    Printed on:11/22/11 2046   Medication Information                      hydrocortisone 2.5 % cream  Apply  topically 2 (two) times daily.             Prenatal MV-Min-Fe Fum-FA-DHA (PRENATAL 1) 30-0.975-200 MG CAPS  Take 1 capsule by mouth daily.                 No medication comments found.     Contraceptive Plans:  Micronor   Follow-Up Visits:  Current and Future Appointments at [facility] (120 Days)    Date Time Visit Type Department Length    11/24/11 11:00 AM RETURN OB 30 [facility] [200701] 30    12/08/11 10:40 AM RETURN OB 20 [facility] [201301] 20    12/15/11 10:30 AM NUT 30 [facility] [201302] 30    01/10/12 11:20 AM RETURN OB 20 [facility] [201301] 40            Discharging Provider:  Darlis Loan CNM

## 2011-11-22 NOTE — Progress Notes (Signed)
Spending day with baby.  Breast feeding.  Baby sleepy during the morning but did breast feed.

## 2011-11-22 NOTE — Discharge Instructions (Signed)
Instrues de [facility]    Durante 4 a 6 semanas aps o parto, voc deve tomar cuidados extras consigo prpria seguindo estas orientaes:    No insira nada na vagina (no use absorventes internos, no tenha relaes sexuais, no faa lavagens vaginais, etc). Se voc levou pontos, estes se dissolvero e no precisam ser removidos.  Urine frequentemente (logo que sentir vontade).  Pratique uma boa higiene vaginal (use a garrafa de irrigao (peri bottle) aps ir ao banheiro).  Faa o banho de assento (sitz bath) conforme indicado.  No levante peso (nada mais pesado que o beb).  Descanse bastante (descanse enquanto o beb dorme).  Alimente-se bem e beba bastante lquidos.  Fale com seu mdico ou sua parteira antes de voltar a fazer exerccios vigorosos. Voc pode fazer caminhadas curtas diariamente e voc talvez ache isto bem agradvel.  Faa os exerccios Kegel como a enfermeira lhe ensinou.      Avise a seu Provedor de Sade sobre qualquer uma das seguintes situaes:    [place] vaginal forte (encharcando mais de um absorvente higinico por hora).  Febre, temperatura superior a 100,4 graus F.  Dor abdominal que no  aliviada pelo analgsico.  Se voc no evacuar dentro de um perodo de 3 a 4 dias, ou se tiver dores ao evacuar.  Seios inchados doloridos ou com vermelhido.  Dor, ardncia ou secreo com odor ftido na regio vaginal.  Dor, vermelhido, secreo ou rompimento da linha da inciso, (se voc fez uma cesariana)  Qualquer sinal de depresso (se voc acha que no consegue cuidar do beb ou de si prpria, se est muito triste ou se voc apenas precisa de algum para conversar).

## 2011-11-23 ENCOUNTER — Telehealth (HOSPITAL_BASED_OUTPATIENT_CLINIC_OR_DEPARTMENT_OTHER): Payer: Self-pay | Admitting: Advanced Practice Midwife

## 2011-11-23 LAB — SURGICAL PATH SPECIMEN

## 2011-11-23 MED ORDER — HYDROCORTISONE ACETATE 25 MG PR SUPP
25.0000 mg | Freq: Two times a day (BID) | RECTAL | Status: DC
Start: 2011-11-23 — End: 2011-11-23

## 2011-11-23 MED ORDER — HYDROCORTISONE 2.5 % PR CREA
TOPICAL_CREAM | Freq: Two times a day (BID) | RECTAL | Status: DC
Start: 2011-11-23 — End: 2011-11-23
  Filled 2011-11-23: qty 30

## 2011-11-23 NOTE — Progress Notes (Signed)
D/c note  VSS all ppp checks WNL  br feeding,assisted and reinforced good positioning for deep latch,  All d/c teaching done with interpreter as per teaching guide  F/u appt made and given  'scripts reviewed as written and given

## 2011-11-23 NOTE — Telephone Encounter (Signed)
Appointment scheduled on 12/08/11 at 10:40 am for 2 wks pp and 01/10/12 at 11:20 am for 6 wks pp with Mare Ferrari. I left a message for the patient to return my call.

## 2011-11-23 NOTE — Progress Notes (Signed)
PT BREASTFEEDING WITH NIPPLE SHIELD SINCE BABY NOTED TO HAVE TIGHT FRENULUM AT 1545.   BABY WAS POPPING OFF BREAST AND LATCH WAS SHALLOW ON FLAT NIPPLES THAT COME OUT WITH STIMULATION. ONCE ON SHIELD BABY HAD EXCELLENT LATCH TO AREOLA AND MANY AUDIBLE SWALLOWS. DAD KEEPING I AND O LOG. PT DENIES PAIN AND IS READY FOR DISCHARGE,  COMFORTABLE WITH PLAN TO BREAST FEED WITH SHIELD AND BABY HAS FOLLOW UP TOMORROW WITH ENT FOR ASSESSMENT OF TONGUE, THEN WILL HAVE TO WORK WITH SHIELD AND ASSESS IF NEEDED PAST FRENULUM CLIPPED, IF CLIPPED.  MEDS AND SCRIPTS AND APPOINTMENTS EXPLAINED VIA PHONE INTERPRETER. PT HAS 2 AND 6 WEEK APPOINTMENTS WITH CNM, PT CAN HAVE CNM AND BREAST SUPPORT GROUP ASSESS NEED FOR SHIELD AS WELL AS DR Elonda Husky WHO WILL SEE BABY IN AM. ID BANDS CUT, SHEET SIGNED AND BABY IN CARSEAT. DISCHARGED WITH BABY,ACCOMPANIED BY FOB.

## 2011-11-24 ENCOUNTER — Ambulatory Visit (HOSPITAL_BASED_OUTPATIENT_CLINIC_OR_DEPARTMENT_OTHER): Payer: PRIVATE HEALTH INSURANCE | Admitting: "Women's Health Care

## 2011-11-24 MED FILL — Ropivacaine HCl Inj 2 MG/ML: INTRAMUSCULAR | Qty: 100 | Status: AC

## 2011-11-24 MED FILL — Lidocaine Inj 2% w/ Epinephrine-1:200000 (PF): INTRAMUSCULAR | Qty: 20 | Status: AC

## 2011-11-27 ENCOUNTER — Encounter (HOSPITAL_BASED_OUTPATIENT_CLINIC_OR_DEPARTMENT_OTHER): Payer: Self-pay | Admitting: Obstetrics & Gynecology

## 2011-11-27 NOTE — Progress Notes (Signed)
Please review Placental Pathology on this CNM patient  I delivered via VAVD last weekend.  Thank you  Windell Norfolk, MD

## 2011-12-08 ENCOUNTER — Encounter (HOSPITAL_BASED_OUTPATIENT_CLINIC_OR_DEPARTMENT_OTHER): Payer: Self-pay | Admitting: Advanced Practice Midwife

## 2011-12-08 ENCOUNTER — Ambulatory Visit (HOSPITAL_BASED_OUTPATIENT_CLINIC_OR_DEPARTMENT_OTHER): Payer: MEDICAID | Admitting: Advanced Practice Midwife

## 2011-12-08 NOTE — Progress Notes (Signed)
VAVD 11/21/2011 Female Sophia 6lb 10 oz apgar 7/8 MLE 39w 3 days IOL for AMA. Doing well denies depression smiling. Difficult first few days. Doing well. Breast feeding. Considering options for FP. May want to get pregnant in a year.   BP mildly elevated  Breast soft  Perineum healing well no redness or swelling well approximated  Mare Ferrari CNM

## 2011-12-15 ENCOUNTER — Ambulatory Visit (HOSPITAL_BASED_OUTPATIENT_CLINIC_OR_DEPARTMENT_OTHER): Payer: MEDICAID | Admitting: Registered"

## 2011-12-15 ENCOUNTER — Telehealth (HOSPITAL_BASED_OUTPATIENT_CLINIC_OR_DEPARTMENT_OTHER): Payer: Self-pay

## 2011-12-15 NOTE — Telephone Encounter (Signed)
Follow up PP telephone call    In house portuguese interp not available at this time    Will call pt at another time

## 2011-12-22 NOTE — Telephone Encounter (Signed)
Follow up PP telephone call    Phone rings x1 and then disconnects - attempt x2     Unable to reach pt

## 2012-01-10 ENCOUNTER — Encounter (HOSPITAL_BASED_OUTPATIENT_CLINIC_OR_DEPARTMENT_OTHER): Payer: Self-pay | Admitting: Advanced Practice Midwife

## 2012-01-10 ENCOUNTER — Ambulatory Visit (HOSPITAL_BASED_OUTPATIENT_CLINIC_OR_DEPARTMENT_OTHER): Payer: PRIVATE HEALTH INSURANCE | Admitting: Advanced Practice Midwife

## 2012-01-10 DIAGNOSIS — Z3009 Encounter for other general counseling and advice on contraception: Secondary | ICD-10-CM

## 2012-01-10 DIAGNOSIS — R21 Rash and other nonspecific skin eruption: Secondary | ICD-10-CM

## 2012-01-10 MED ORDER — CLOBETASOL PROPIONATE 0.05 % EX CREA
TOPICAL_CREAM | Freq: Two times a day (BID) | CUTANEOUS | Status: AC
Start: 2012-01-10 — End: 2013-01-09

## 2012-01-10 MED ORDER — NORGESTIM-ETH ESTRAD TRIPHASIC 0.18/0.215/0.25 MG-25 MCG PO TABS
1.0000 | ORAL_TABLET | Freq: Every day | ORAL | Status: DC
Start: 2012-01-10 — End: 2012-04-05

## 2012-01-10 NOTE — Progress Notes (Signed)
SUBJECTIVE:  41 year old female G1P1001 presents for postpartum exam.  Has had atypical dermitis that was resolved with ant  She is status-post Vacuum Assisted Delivery on  a 6lb 10oz female. Apgar Score: 7/9. Baby's Name: Sophia.  Details: Bottle fed, Chorio, Failure to Progress and Induction. Anesthesia: Epidural.    Perineum: Medio-lateral episiotomy.  Feeding: Bottle  Postpartum course: Complicated by AMA > 49 yo  Contraceptive plans: OCP  I have reviewed the patient's PMH for contraceptive risk factors,   and she has no contraindications to contraception as planned. See   Histories section of EpicCare.    OBJECTIVE:  PHQ-9 Score: 0  DV Assessment: Negative  Appears well, vital signs normal.  Breast Exam: breasts symmetric, no dominant or suspicious mass, no skin or nipple changes, no axillary adenopathy and self exam in taught and encouraged.  Perineum: well healed, tender and granulation.  Pelvic Exam:External genitalia and vagina normal. Bimanual and rectovaginal exam normal     ASSESSMENT:  normal postpartum course and OCP.    PLAN:  Contraception initiated today, Review OCP risk benefits dangers signs. Signed consent. To start on Otho tryclen Lo. FU in 3 months BP check.   Review recommendations Mammogram at age 53  Pt not breastfeeding. To schedule  Mare Ferrari CNM

## 2012-01-19 ENCOUNTER — Telehealth (HOSPITAL_BASED_OUTPATIENT_CLINIC_OR_DEPARTMENT_OTHER): Payer: Self-pay

## 2012-01-19 NOTE — Telephone Encounter (Signed)
Error

## 2012-03-28 ENCOUNTER — Telehealth (HOSPITAL_BASED_OUTPATIENT_CLINIC_OR_DEPARTMENT_OTHER): Payer: Self-pay | Admitting: Advanced Practice Midwife

## 2012-03-28 NOTE — Progress Notes (Signed)
.  appointment rescheduled to 04/05/12 at 11;40 am with Mare Ferrari. Patient agreed.

## 2012-04-04 ENCOUNTER — Telehealth (HOSPITAL_BASED_OUTPATIENT_CLINIC_OR_DEPARTMENT_OTHER): Payer: Self-pay | Admitting: Advanced Practice Midwife

## 2012-04-04 NOTE — Progress Notes (Signed)
Appointment reminder.I left a message for the patient to return my call.

## 2012-04-05 ENCOUNTER — Ambulatory Visit (HOSPITAL_BASED_OUTPATIENT_CLINIC_OR_DEPARTMENT_OTHER): Payer: PRIVATE HEALTH INSURANCE | Admitting: Advanced Practice Midwife

## 2012-04-05 ENCOUNTER — Encounter (HOSPITAL_BASED_OUTPATIENT_CLINIC_OR_DEPARTMENT_OTHER): Payer: Self-pay | Admitting: Advanced Practice Midwife

## 2012-04-05 VITALS — BP 124/84 | Wt 177.0 lb

## 2012-04-05 DIAGNOSIS — Z1239 Encounter for other screening for malignant neoplasm of breast: Secondary | ICD-10-CM

## 2012-04-05 DIAGNOSIS — Z1231 Encounter for screening mammogram for malignant neoplasm of breast: Secondary | ICD-10-CM

## 2012-04-05 DIAGNOSIS — Z309 Encounter for contraceptive management, unspecified: Secondary | ICD-10-CM

## 2012-04-05 DIAGNOSIS — Z3009 Encounter for other general counseling and advice on contraception: Secondary | ICD-10-CM

## 2012-04-05 MED ORDER — NORGESTIM-ETH ESTRAD TRIPHASIC 0.18/0.215/0.25 MG-25 MCG PO TABS
1.0000 | ORAL_TABLET | Freq: Every day | ORAL | Status: AC
Start: 2012-04-05 — End: 2012-07-04

## 2012-04-05 NOTE — Patient Instructions (Signed)
PATIENT EDUCATION - Breast Cancer Screening      A little of your time can make a lifetime of difference.   Get your preventive health tests.      Breast cancer is the second leading cause of cancer deaths in American women.  There is no proven way to not get breast cancer.  Along with yearly physical exams, mammography is the best way to find breast cancer and save lives.    Facts you should know about why mammography is so important:    **   80 % of all breast cancer occurs in women with no family history  **   Age is the most important risk factor for developing breast cancer  **   One time is not enough.    **   Starting at age 40, you need to get a mammogram every year    Please call my office to schedule a mammogram or your first visit.  We can also schedule a routine Pap smear for cervical cancer screening. We need to know if you have already scheduled any tests with an OB/GYN.  Please tell us so we can update your record.     Remember:  Finding breast cancer early = increased chances for successful treatment.    Remember, early detection greatly increases the chances for successful treatment.

## 2012-04-18 ENCOUNTER — Encounter (HOSPITAL_BASED_OUTPATIENT_CLINIC_OR_DEPARTMENT_OTHER): Payer: Self-pay | Admitting: Advanced Practice Midwife

## 2012-04-24 NOTE — Progress Notes (Signed)
Pt here for OCP check  (V25.9) Contraception management  (primary encounter diagnosis)  Comment: Stopped Breastfeeding  Plan:   Doing well on POP though no longer Breastfeeding Review option for combined OCP. Pt interested in switching.\  FU in 3 months    (V76.10) Breast cancer screening  Comment: 41 years old   Plan: Reviewed Breast cancer screening.  ACOG and ACA recommendation starting at age 97 vs the USPSTF starting at age 20. Pt wants to start early    I have spent 15 minutes in face to face time with this patient/patient proxy of which 100 % was in counseling or coordination of care regarding above issues/Dx.  Mare Ferrari CNM

## 2012-08-21 ENCOUNTER — Telehealth (HOSPITAL_BASED_OUTPATIENT_CLINIC_OR_DEPARTMENT_OTHER): Payer: Self-pay

## 2012-08-21 NOTE — Progress Notes (Signed)
.  please ignore this message

## 2012-08-21 NOTE — Progress Notes (Signed)
.  called patient left a vm message to please call clinic and schedule an appt.  She is scheduled with Huntley Dec for tomorrow but the appt needs to be moved to a different time.

## 2012-08-22 ENCOUNTER — Ambulatory Visit (HOSPITAL_BASED_OUTPATIENT_CLINIC_OR_DEPARTMENT_OTHER): Payer: PRIVATE HEALTH INSURANCE | Admitting: Registered Nurse

## 2012-08-23 ENCOUNTER — Encounter (HOSPITAL_BASED_OUTPATIENT_CLINIC_OR_DEPARTMENT_OTHER): Payer: Self-pay | Admitting: Registered Nurse

## 2012-08-23 ENCOUNTER — Ambulatory Visit (HOSPITAL_BASED_OUTPATIENT_CLINIC_OR_DEPARTMENT_OTHER): Payer: PRIVATE HEALTH INSURANCE | Admitting: Registered Nurse

## 2012-08-23 VITALS — Temp 97.6°F

## 2012-08-23 DIAGNOSIS — J111 Influenza due to unidentified influenza virus with other respiratory manifestations: Secondary | ICD-10-CM

## 2012-08-23 DIAGNOSIS — Z23 Encounter for immunization: Secondary | ICD-10-CM

## 2012-08-25 ENCOUNTER — Other Ambulatory Visit (HOSPITAL_BASED_OUTPATIENT_CLINIC_OR_DEPARTMENT_OTHER): Payer: Self-pay

## 2012-08-25 NOTE — Telephone Encounter (Signed)
Left message for pt to return my call  for mammo.

## 2012-10-10 ENCOUNTER — Other Ambulatory Visit (HOSPITAL_BASED_OUTPATIENT_CLINIC_OR_DEPARTMENT_OTHER): Payer: Self-pay

## 2012-10-10 NOTE — Telephone Encounter (Signed)
Left

## 2012-10-25 ENCOUNTER — Encounter (HOSPITAL_BASED_OUTPATIENT_CLINIC_OR_DEPARTMENT_OTHER): Payer: Self-pay

## 2012-11-14 ENCOUNTER — Encounter (HOSPITAL_BASED_OUTPATIENT_CLINIC_OR_DEPARTMENT_OTHER): Payer: Self-pay | Admitting: Physician Assistant

## 2012-11-14 ENCOUNTER — Emergency Department (HOSPITAL_BASED_OUTPATIENT_CLINIC_OR_DEPARTMENT_OTHER)
Admission: RE | Admit: 2012-11-14 | Disposition: A | Discharge status: Home / Self Care | Payer: Self-pay | Source: Emergency Department | Attending: Physician Assistant | Admitting: Physician Assistant

## 2012-11-14 MED ORDER — FAMOTIDINE 20 MG PO TABS
20.00 mg | ORAL_TABLET | Freq: Two times a day (BID) | ORAL | Status: AC
Start: 2012-11-14 — End: 2012-11-17

## 2012-11-14 MED ORDER — FAMOTIDINE 20 MG PO TABS
20.00 mg | ORAL_TABLET | Freq: Once | ORAL | Status: AC
Start: 2012-11-14 — End: 2012-11-14
  Administered 2012-11-14: 20 mg via ORAL
  Filled 2012-11-14: qty 1

## 2012-11-14 MED ORDER — DIPHENHYDRAMINE HCL 25 MG PO CAPS
50.0000 mg | ORAL_CAPSULE | Freq: Four times a day (QID) | ORAL | Status: AC | PRN
Start: 2012-11-14 — End: 2012-11-17

## 2012-11-14 MED ORDER — DIPHENHYDRAMINE HCL 25 MG PO CAPS
25.00 mg | ORAL_CAPSULE | Freq: Once | ORAL | Status: AC
Start: 2012-11-14 — End: 2012-11-14
  Administered 2012-11-14: 25 mg via ORAL
  Filled 2012-11-14: qty 1

## 2012-11-14 NOTE — ED Triage Note (Signed)
Woke with itchy rash

## 2012-11-14 NOTE — ED Notes (Signed)
Patient Disposition    Patient education for diagnosis, medications, activity, diet and follow-up.  Patient left ED 12:26 PM.  Patient rep received written instructions.  Interpreter to provide instructions: No    Discharged to: Discharged to home

## 2012-11-14 NOTE — ED Provider Notes (Signed)
ED nursing record was reviewed. Prior records as available electronically through the Epic record were reviewed.      HPI:    This 41 year old female patient with PMH significant for GERD, rheumatoid arthritis, pes planus, and labile blood pressure presents to the Emergency Department with hives since this morning.  Patient reports similar symptoms once previously.  She reports an allergy to norfloxacin.  Otherwise, no known allergies.  Denies use of new detergents, soaps, foods, or medications.  No exposure to animals complaints, or pets.  Denies chest pain, wheezing, or shortness of breath.  No GI symptoms.  No tongue or lip swelling.  Maintaining airway and secretions without issue.    Past Medical History/Problem list:    Past Medical History    Other advanced maternal age in pregnancy     HTN (hypertension)     Comment: Labile Blood pressures no medications    Down's syndrome     Comment: Cousin : Mother 66      Patient Active Problem List:     GERD (Gastroesophageal Reflux Disease)     Pes planus     Labile blood pressure     Family history of Down syndrome     Infertility, female          Past Surgical History:     Past Surgical History    TONSILLECTOMY ONE-HALF AGE 89/>  age 54          Medications:   No current facility-administered medications on file prior to encounter.  Current Outpatient Prescriptions on File Prior to Encounter:  clobetasol (TEMOVATE) 0.05 % cream Apply  topically 2 (two) times daily. Disp: 1 tube Rfl: 12   Prenatal Vit-Fe Fumarate-FA (PRENATAL MULTIVITAMIN) 27-0.8 MG TABS Take 1 tablet by mouth daily. Disp: 30 tablet Rfl: 11   Prenatal MV-Min-Fe Fum-FA-DHA (PRENATAL 1) 30-0.975-200 MG CAPS Take 1 capsule by mouth daily. Disp: 30 each Rfl: 12         Social History:   Social History   Marital Status: Married  Spouse Name: N/A    Years of Education: N/A  Number of Children: N/A     Occupational History  None on file     Social History Main Topics   Smoking status: Former Smoker      Smokeless tobacco:     Comment:     Alcohol Use: Yes    Comment: socially     Drug Use: No    Sexually Active: Yes  Partner(s): Female    Pharmacist, hospital Protection: Pill    Comment: Diane 35 from [place]      Other Topics Concern    Military Service No    Blood Transfusions No    Caffeine Concern No    Occupational Exposure No    Hobby Hazards No    Sleep Concern No    Stress Concern No    Weight Concern No    Special Diet No    Back Care No    Exercise No    Bike Helmet No    Seat Belt No    Self-Exams No     Social History Narrative    01/09/10 Married, no children.  Works in [employer].  [employer]    MARITAL STATUS:single, no children ,  To  [place] 11 years from [place]     OCCUPATION: store owner , also work at [employer] WITH; with boy friend  SUPPORT;good     FUNCTIONAL STATUS; independent         09/20/11    Patient is from [place]. Lives with her husband. 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 She is 07 months pregnant. This is her first pregnancy.                               Allergies:  Review of Patient's Allergies indicates:   Norfloxacin             Hives      Physical Exam:  BP 125/87  Pulse 78  Temp(Src) 98 F  Resp 16  SpO2 100%  LMP 11/07/2012    GENERAL: Afebrile. No acute distress, non-toxic.   SKIN:  Warm & Dry, no rash.  Erythematous raised lesions on the face, upper extremities, trunk, and lower extremities that blanch on palpation.  No bleeding or drainage.  No excoriations.  HEAD:  NCAT. Sclerae are anicteric and aninjected. Buccal mucosa pink with moist mucous membranes.  No tongue or lip swelling.  Oropharynx is without erythema, edema or exudate. Tonsils are symmetric. Uvula is midline. PERRL. EOMI. Bilateral TMs clear.  NECK:  Neck supple.  No lymphadenopathy. No stridor.  LUNGS:  Clear to auscultation bilaterally. No wheezes, rales, rhonchi.   HEART:  RRR.  No murmurs, rubs, or gallops.   EXTREMITIES:  No obvious deformities.    NEUROLOGIC:  Alert and oriented x4; moves  all extremities well; speaking in clear fluent sentences. Gait os normal.    PSYCHIATRIC:  Normal affect. Calm and cooperative.     ED Course and Medical Decision-making:  This 41 year old female patient with PMH significant for GERD, rheumatoid arthritis, pes planus, and labile blood pressure presents to the Emergency Department with hives since this morning.Vital signs were reviewed and are within normal limits.     Patient received Benadryl 50 milligrams and Pepcid 20 milligrams in the emergency department with subjective improvement of symptoms.  Reevaluation notes minimal pruritus and urticaria has decreased in severity.    She is discharged home with prescription for Benadryl 50 milligrams to take every 6 hours x3 days and Pepcid 20 milligrams twice daily x2 days.  Patient will followup with her primary care physician in 2-3 days if symptoms do not improve.  Encouraged to return to the emergency department any time for worsening symptoms, new symptoms, inability to arrange followup, or for any concerns    Condition: Stable and improved  Disposition: Home      Diagnosis/Diagnoses:  Urticaria    Criselda Peaches, PA-C

## 2012-11-14 NOTE — ED Notes (Signed)
PA at bedside for reeval.

## 2012-11-14 NOTE — ED Notes (Signed)
Ras not significantly improved. PA aware.

## 2012-11-14 NOTE — ED Notes (Signed)
Itching gone, still fine rash over body but much less

## 2012-11-14 NOTE — Discharge Instructions (Signed)
You were seen in the emergency department today for a one-day history of hives.  We are unsure of the cause of the hives.  However, they resolved with Benadryl Pepcid in the emergency department.    I am discharging you home with a prescription for Benadryl 50 milligrams to take every 6 hours as needed x3 days and Pepcid 20 milligrams twice daily x3 days.  Benadryl may make you drowsy.  Do not drink alcohol taking this medication.     Please let your PCP know you were seen in the emergency department today.  Arrange followup as needed.    Return to the emergency department at any time for worsening symptoms, new symptoms, inability to arrange follow, or for any other concerns.

## 2012-11-16 ENCOUNTER — Telehealth (HOSPITAL_BASED_OUTPATIENT_CLINIC_OR_DEPARTMENT_OTHER): Payer: Self-pay | Admitting: Registered Nurse

## 2012-11-16 NOTE — Telephone Encounter (Signed)
Message copied by Olevia Bowens on Thu Nov 16, 2012  2:10 PM  ------       Message from: Natalia Leatherwood       Created: Thu Nov 16, 2012  1:50 PM       Regarding: ER f/u [facility] " hives"                        Denise Ryan [id number], 42 year old, female, Telephone Information:       Home Phone      [phone]       Work Phone      Not on file.       Mobile          [phone]                     Patient's Preferred Pharmacy:               [facility]       Phone: [phone] Fax: [phone]              [id number] - [place], [place] - [address] AT [facility]       Phone: [phone] Fax: [phone]                     CONFIRMED TODAY:               Cleotis Lema NUMBER: [phone]       Best time to call back: anytime        Cell phone:        Other phone:              Available times:              Patient's language of care: [place]              Patient needs a [place] interpreter.              Patient's PCP: Shelah Lewandowsky, MD              Person calling on behalf of patient: Patient (self)              Calls today pt with allergy, was seen ER [facility] on 12/31 for hives" was told to f/u with pcp if symptom havent change, pt would like to be seen today, advise pt on time and offered 01/03 @ [address] Dr Maggie Schwalbe pt decline.               pls advise                  ------

## 2012-11-16 NOTE — Progress Notes (Signed)
Call to pt.  Reports was seen in ER for "hives". Told to take benedryl, and has been doing, but feels like it is "an allergy of some sort".   Wants f/u and new plan.  Appt for tom with urgent care provider.  She agrees.

## 2012-11-17 ENCOUNTER — Ambulatory Visit (HOSPITAL_BASED_OUTPATIENT_CLINIC_OR_DEPARTMENT_OTHER): Payer: Medicaid Other

## 2012-12-05 ENCOUNTER — Ambulatory Visit (HOSPITAL_BASED_OUTPATIENT_CLINIC_OR_DEPARTMENT_OTHER): Payer: MEDICAID

## 2012-12-05 ENCOUNTER — Encounter (HOSPITAL_BASED_OUTPATIENT_CLINIC_OR_DEPARTMENT_OTHER): Payer: Self-pay

## 2012-12-05 VITALS — BP 130/80 | HR 87 | Temp 98.1°F | Resp 16 | Ht 64.0 in | Wt 178.0 lb

## 2012-12-05 DIAGNOSIS — L509 Urticaria, unspecified: Secondary | ICD-10-CM

## 2012-12-05 DIAGNOSIS — R5383 Other fatigue: Secondary | ICD-10-CM

## 2012-12-05 LAB — CBC, PLATELET & DIFFERENTIAL
ABSOLUTE BASO COUNT: 0 10*3/uL (ref 0.0–0.1)
ABSOLUTE EOSINOPHIL COUNT: 0.2 10*3/uL (ref 0.0–0.8)
ABSOLUTE IMM GRAN COUNT: 0.02 10*3/uL (ref 0.00–0.03)
ABSOLUTE LYMPH COUNT: 3.2 10*3/uL (ref 0.6–5.9)
ABSOLUTE MONO COUNT: 0.6 10*3/uL (ref 0.2–1.4)
ABSOLUTE NEUTROPHIL COUNT: 4.8 10*3/uL (ref 1.6–8.3)
BASOPHIL %: 0.2 % (ref 0.0–1.2)
EOSINOPHIL %: 2.3 % (ref 0.0–7.0)
HEMATOCRIT: 41 % (ref 34.1–44.9)
HEMOGLOBIN: 13.2 g/dL (ref 11.2–15.7)
IMMATURE GRANULOCYTE %: 0.2 % (ref 0.0–0.4)
LYMPHOCYTE %: 36.5 % (ref 15.0–54.0)
MEAN CORP HGB CONC: 32.2 g/dL (ref 31.0–37.0)
MEAN CORPUSCULAR HGB: 28.9 pg (ref 26.0–34.0)
MEAN CORPUSCULAR VOL: 89.7 fL (ref 80.0–100.0)
MEAN PLATELET VOLUME: 11.5 fL (ref 8.7–12.5)
MONOCYTE %: 7 % (ref 4.0–13.0)
NEUTROPHIL %: 53.8 % (ref 40.0–75.0)
PLATELET COUNT: 227 10*3/uL (ref 150–400)
RBC DISTRIBUTION WIDTH STD DEV: 42.8 fL (ref 35.1–46.3)
RBC DISTRIBUTION WIDTH: 13.1 % (ref 11.5–14.3)
RED BLOOD CELL COUNT: 4.57 M/uL (ref 3.90–5.20)
WHITE BLOOD CELL COUNT: 8.8 10*3/uL (ref 4.0–11.0)

## 2012-12-05 MED ORDER — LORATADINE 10 MG PO TABS
10.0000 mg | ORAL_TABLET | Freq: Every day | ORAL | Status: DC
Start: 2012-12-05 — End: 2014-10-25

## 2012-12-05 NOTE — Progress Notes (Signed)
Patient safe at home

## 2012-12-05 NOTE — Progress Notes (Signed)
SUBJECTIVE:  Denise Ryan is a 42 year old female who presents Er f/up for hives.  Has had them in the past but this was different.  This was like bublles. Given Benadryl for 2 weeks and this worked but made her feel very tired.  She then stopped and it came back.  Then stopped again and they have been gone now for 4 days.    Only med isprenatal vitsamins    Also with fatigue for a while now, more than 1 month. Periods not heavy.  Getting pretty good sleep.       ROS:   General:  No fevers or chills  ENT:  No no trouble swallowing, no mouth swelling  Resp:  No SOB or cough  CV:  No CP or palps  GI:  NO abd upset  MS:  No joint or muscle pain  Skin:  No rash    MEDICATIONS:    Current Outpatient Prescriptions:  clobetasol (TEMOVATE) 0.05 % cream Apply  topically 2 (two) times daily. Disp: 1 tube Rfl: 12   Prenatal Vit-Fe Fumarate-FA (PRENATAL MULTIVITAMIN) 27-0.8 MG TABS Take 1 tablet by mouth daily. Disp: 30 tablet Rfl: 11     No current facility-administered medications for this visit.    ALLERGIES:  Review of Patient's Allergies indicates:   Norfloxacin             Hives    OBJECTIVE:      PHYSICAL EXAM:     12/05/12  1545   BP: 130/80   Pulse: 87   Temp: 98.1 F (36.7 C)   TempSrc: Temporal   Resp: 16   Height: 5\' 4"  (1.626 m)   Weight: 178 lb (80.74 kg)   SpO2: 97%     Constitutional: Well developed, Well nourished, No acute distress, Non-toxic appearance.   HEENT: Normocephalic, Atraumatic, Bilateral external ears normal, Oropharynx moist, No oral exudates, Nose normal, PERRLA.  Cardiovascular: Normal heart rate, Normal rhythm, No murmurs, No rubs, No gallops.   Thorax & Lungs: Normal breath sounds, No respiratory distress, No wheezing, No chest tenderness.   Abdomen: Soft, Non tender, No guarding, No masses, Normal bowel sounds.  Extremities: Intact distal pulses, No edema.  Skin:  No rash or lesions    ASSESSMENT/  PLAN:  42 yo woman    (708.9) Urticaria  (primary encounter  diagnosis)  Comment: about 3 weeks ago with unclear trigger. Has had these in past.  This time, episode lasted a while and recurred when she stopped taking the Benadryl.  It has now resolved for the last 5 days or so.  Benadryl made her very sleepy  Plan: loratadine (CLARITIN) 10 MG tablet        Use Loratadine prn with hives.  W/c re-starting Pepcid or other H1 blocker also and w/c allergy testing if this recurs and is a problem.    (780.79) Fatigue  Comment: Has felt generally fatigued over the last month or mor. Periods not heavy.  Plan: CBC + PLT + AUTO DIFF, THYROID SCREEN TSH         REFLEX FT4, VITAMIN D,25 HYDROXY          RHM:  Continues on prenatal vits as she is again attempting conception. Got Tdap and nl Pap in 2012.    I will contact her with results (in [place]) and she will let me know if hives recur and are not treated by meds.          1.  The patient indicates understanding of these issues and agrees with the plan.  2.  The patient is given an After Visit Summary sheet that lists all of their medications with directions, their allergies, orders placed during this encounter, immunization dates, and follow- up instructions.  3. I reviewed the patient's medical information and medical history   4.  I reconciled the patient's medication list and prepared and supplied needed refills.  5.  I have reviewed the past medical, family, and social history sections including the medications and allergies listed in the above medical record      Electronically signed by: Shelah Lewandowsky, MD, 12/05/2012 3:54 PM  This note is electronically signed in the electronic medical record.

## 2012-12-06 ENCOUNTER — Other Ambulatory Visit (HOSPITAL_BASED_OUTPATIENT_CLINIC_OR_DEPARTMENT_OTHER): Payer: Self-pay | Admitting: Advanced Practice Midwife

## 2012-12-06 ENCOUNTER — Encounter (HOSPITAL_BASED_OUTPATIENT_CLINIC_OR_DEPARTMENT_OTHER): Payer: Self-pay

## 2012-12-08 LAB — THYROID SCREEN TSH REFLEX FT4: THYROID SCREEN TSH REFLEX FT4: 0.61 u[IU]/mL (ref 0.34–5.60)

## 2012-12-08 LAB — VITAMIN D,25 HYDROXY: VITAMIN D,25 HYDROXY: 37.2 ng/ml (ref 30.0–100.0)

## 2012-12-12 ENCOUNTER — Telehealth (HOSPITAL_BASED_OUTPATIENT_CLINIC_OR_DEPARTMENT_OTHER): Payer: Self-pay

## 2012-12-12 NOTE — Progress Notes (Signed)
To our RNs.    Can you please call to let Pt know that her lab results are all normal (blood counts, thyroid and Vitamin D level).    Thanks,  Deeann Dowse

## 2012-12-12 NOTE — Progress Notes (Signed)
Call to pt.  Lm on idenfified vm that all labs WNL and to call us back if ?'s/concerns.

## 2013-04-24 ENCOUNTER — Other Ambulatory Visit (HOSPITAL_BASED_OUTPATIENT_CLINIC_OR_DEPARTMENT_OTHER): Payer: Self-pay

## 2013-04-24 DIAGNOSIS — L509 Urticaria, unspecified: Secondary | ICD-10-CM

## 2013-04-24 MED ORDER — CLOBETASOL PROPIONATE 0.05 % EX CREA
TOPICAL_CREAM | Freq: Two times a day (BID) | CUTANEOUS | Status: DC
Start: 2013-04-24 — End: 2013-10-23

## 2013-04-24 NOTE — Progress Notes (Signed)
Pt here with her daughter and asking for a refill on her Clobetasol that she uses occasionally for hives.

## 2013-07-17 ENCOUNTER — Ambulatory Visit (HOSPITAL_BASED_OUTPATIENT_CLINIC_OR_DEPARTMENT_OTHER): Payer: Medicaid Other | Admitting: Physician Assistant

## 2013-07-18 ENCOUNTER — Ambulatory Visit (HOSPITAL_BASED_OUTPATIENT_CLINIC_OR_DEPARTMENT_OTHER): Payer: Medicaid Other | Admitting: Physician Assistant

## 2013-07-18 ENCOUNTER — Ambulatory Visit (HOSPITAL_BASED_OUTPATIENT_CLINIC_OR_DEPARTMENT_OTHER): Payer: Self-pay | Admitting: Physician Assistant

## 2013-07-18 VITALS — BP 120/80 | HR 75 | Temp 98.3°F | Resp 16 | Wt 174.0 lb

## 2013-07-18 DIAGNOSIS — M79671 Pain in right foot: Secondary | ICD-10-CM

## 2013-07-18 LAB — XR FOOT RIGHT MINIMUM 3 VIEWS

## 2013-07-18 MED ORDER — PRENATAL 27-0.8 MG PO TABS
1.0000 | ORAL_TABLET | Freq: Every day | ORAL | Status: AC
Start: 2013-07-18 — End: 2014-06-17

## 2013-07-18 NOTE — Progress Notes (Signed)
.    Pt safe at home. OME

## 2013-07-18 NOTE — Patient Instructions (Signed)
Ice to the area. Tylenol as needed for pain.

## 2013-07-18 NOTE — Progress Notes (Signed)
Denise Ryan is a 42 year old female who presents with pain to the right foot. Had a mis-steps while wearing sandals 3 weeks prior. Is at home with her children. Pain worse with movements. Of note she is hoping to get pregnant again.        Review of Systems   Constitutional: Negative.    HENT: Negative.    Musculoskeletal: Positive for joint pain.   Skin: Negative.    Neurological: Negative.    Psychiatric/Behavioral: Negative.      Physical Exam   Nursing note and vitals reviewed.  Constitutional: She is oriented to person, place, and time. She appears well-developed and well-nourished. No distress.   HENT:   Head: Normocephalic and atraumatic.   Right Ear: External ear normal.   Left Ear: External ear normal.   Eyes: Conjunctivae are normal.   Pulmonary/Chest: Effort normal.   Musculoskeletal: Normal range of motion. She exhibits tenderness.        Right foot: She exhibits tenderness and bony tenderness. She exhibits normal range of motion and no swelling.        Left foot: Normal.        Feet:    Neurological: She is alert and oriented to person, place, and time.   Skin: Skin is warm and dry. She is not diaphoretic.   Psychiatric: She has a normal mood and affect. Her behavior is normal. Judgment and thought content normal.     (729.5) Right foot pain  (primary encounter diagnosis)  Comment: Mid foot strain.   Plan: More supportive shoes. NSAID's prn. Can last for several weeks. Stretching in the am.   XR FOOT RIGHT MINIMUM 3 VW        I have reviewed the past medical, surgical, social and family history and updated these sections of EpicCare as relevant. All interim labs, test results, and consult notes were reviewed and discussed with Denise Ryan. Medications were reconciled during this visit and a current medication list was given to the patient at the end of the visit.

## 2013-07-19 NOTE — Progress Notes (Signed)
Quick Note:    Please review these results.  ______

## 2013-07-19 NOTE — Progress Notes (Signed)
Quick Note:    Your x-ray was negative for any broken bones.     Marguarite Arbour, PA-C, PA    ______

## 2013-10-15 ENCOUNTER — Encounter (HOSPITAL_BASED_OUTPATIENT_CLINIC_OR_DEPARTMENT_OTHER): Payer: Self-pay

## 2013-10-17 ENCOUNTER — Ambulatory Visit (HOSPITAL_BASED_OUTPATIENT_CLINIC_OR_DEPARTMENT_OTHER): Payer: Medicaid Other

## 2013-10-17 ENCOUNTER — Encounter (HOSPITAL_BASED_OUTPATIENT_CLINIC_OR_DEPARTMENT_OTHER): Payer: Self-pay

## 2013-10-17 VITALS — BP 120/90 | HR 85 | Temp 97.6°F | Resp 16 | Ht 64.0 in | Wt 171.0 lb

## 2013-10-17 DIAGNOSIS — R1032 Left lower quadrant pain: Secondary | ICD-10-CM

## 2013-10-17 DIAGNOSIS — IMO0002 Reserved for concepts with insufficient information to code with codable children: Secondary | ICD-10-CM

## 2013-10-17 DIAGNOSIS — R21 Rash and other nonspecific skin eruption: Secondary | ICD-10-CM

## 2013-10-17 LAB — URINE PREGNANCY TEST (POINT OF CARE): HCG QUALITATIVE URINE: NEGATIVE

## 2013-10-17 NOTE — Progress Notes (Signed)
SUBJECTIVE:  Denise Ryan is a 42 year old female who presents lower abd pain.  Does not happen every day.  This has been going on for 1 month or so. Not sure.      Also with pain winth intercourse This pain going on for about 1 month  Periods      Father has kidney cancer and not doing great.  Shakeeta has been traveling back and forth to [place] and is due to return there next week      Trying to get pregnant. Thinks LMP was lat onth but not  Taking prenatal vits      Past Medical History    Other advanced maternal age in pregnancy     HTN (hypertension)     Comment: Labile Blood pressures no medications    Down's syndrome     Comment: Cousin : Mother 8     UTI (lower urinary tract infection) 05/01/2008    Kidney stone 04/10/2008       Social History   Marital Status: Married  Spouse Name: N/A    Years of Education: N/A  Number of Children: N/A     Occupational History  None on file     Social History Main Topics   Smoking status: Former Smoker     Smokeless tobacco: Never Used    Comment: 1 cig /week     Alcohol Use: Yes    Comment: socially     Drug Use: No    Sexual Activity: Yes    Partners: Male    Copy: Pill    Comment: Diane 35 from [place]      Other Topics Concern    Financial planner No    Blood Transfusions No    Caffeine Concern No    Occupational Exposure No    Hobby Hazards No    Sleep Concern No    Stress Concern No    Weight Concern No    Special Diet No    Back Care No    Exercise No    Bike Helmet No    Seat Belt No    Self-Exams No     Social History Narrative        Patient is from [place]. Lives with her husband and daughter, Jeanette Caprice, born in 12/12    Runs a [employer]    No tobacco, safe at home                             ROS:   General:  Sleeping well  ENT:  No congestion  Resp:  No cough or SOB  CV:  No Cp or palps  GI:  Sometimes with constipation, , sometimes with nausea   sure.   GU: No new vag d/c, no dryness, no dysuria  PSych:  Mood is  good    MEDICATIONS:    Current Outpatient Prescriptions:  Prenatal Vit-Fe Fumarate-FA (PRENATAL MULTIVITAMIN) 27-0.8 MG TABS Take 1 tablet by mouth daily. Disp: 30 tablet Rfl: 11   clobetasol (TEMOVATE) 0.05 % cream Apply  topically 2 (two) times daily. Disp: 30 g Rfl: 2   loratadine (CLARITIN) 10 MG tablet Take 1 tablet by mouth daily. Disp: 30 tablet Rfl: 11     No current facility-administered medications for this visit.    ALLERGIES:  Review of Patient's Allergies indicates:   Norfloxacin  Hives    OBJECTIVE:      PHYSICAL EXAM:   10/17/13  1123   Pulse: 85   Temp: 97.6 F (36.4 C)   TempSrc: Temporal   Weight: 171 lb (77.565 kg)   SpO2: 100%     Constitutional: Well developed, Well nourished, No acute distress, Non-toxic appearance.   HEENT: Normocephalic, Atraumatic, Bilateral external ears normal, Oropharynx moist, No oral exudates, Nose normal, PERRLA.  Cardiovascular: Normal heart rate, Normal rhythm, No murmurs, No rubs, No gallops.   Thorax & Lungs: Normal breath sounds, No respiratory distress, No wheezing, No chest tenderness.   Abdomen: Soft,very mildly tender on deep palp LLQ, no rebound or guarding, No guarding, No masses, Normal bowel sounds.  Extremities: Intact distal pulses, No edema.  Pelvic:  No vulvar lesions, vagina moist butr no notable d/c, no masses or lesions, no CMT, no adnexal masses or tendernss  Neurologic:  No focal deficits noted.   Skin:  Subtle increased vascularity both cheeks, no palp lesions  Pscy:  Pleasant, full affect    ASSESSMENT/  PLAN:  42 yo woman    (789.04) Abdominal pain, left lower quadrant  (primary encounter diagnosis)  Comment: Just over last month or 2.  Is always a bit constipated but no change here.  This pain is associated with new dyspareunia.    Exam notable for mildly reproducible pain on both abdominal and pelvic exam, no no exquistie tenderenss or CMT and no d/c of note.  ? Ovarian cyst or polyp or fibroids  Plan: US PELVIC NONOBSTETRIC  REAL-TIME IMAGE COMPLETE            (625.0) Dyspareunia  Comment: As above  Plan: no signs of infection.  Attempting pregnancy and test negative today    (782.1) Rash  Comment: very mild on cheeks, some increased vascularity  Plan: Follow.    I will contact her with U/s results and f/up.  If nnormal W/c Gyn appt.    '      1. The patient indicates understanding of these issues and agrees with the plan.  2.  The patient is given an After Visit Summary sheet that lists all of their medications with directions, their allergies, orders placed during this encounter, immunization dates, and follow- up instructions.  3. I reviewed the patient's medical information and medical history   4.  I reconciled the patient's medication list and prepared and supplied needed refills.  5.  I have reviewed the past medical, family, and social history sections including the medications and allergies listed in the above medical record      Electronically signed by: Shelah Lewandowsky, MD, 10/17/2013 11:25 AM  This note is electronically signed in the electronic medical record.

## 2013-10-23 ENCOUNTER — Ambulatory Visit (HOSPITAL_BASED_OUTPATIENT_CLINIC_OR_DEPARTMENT_OTHER): Payer: Self-pay

## 2013-10-23 ENCOUNTER — Other Ambulatory Visit (HOSPITAL_BASED_OUTPATIENT_CLINIC_OR_DEPARTMENT_OTHER): Payer: Self-pay

## 2013-10-23 DIAGNOSIS — R1032 Left lower quadrant pain: Secondary | ICD-10-CM

## 2013-10-23 LAB — US 3D RENDERING WO DEDICATED WORKSTATION

## 2013-10-23 LAB — US TRANSVAGINAL NON-OB

## 2013-10-23 LAB — US ABDOMEN PELVIS SCROTUM & OR RETROPERITONEUM DOPPLER COMPLETE

## 2013-10-23 LAB — US PELVIC NON-PREGNANT

## 2013-10-23 NOTE — Progress Notes (Signed)
PER Pharmacy, Denise Ryan is a 42 year old female has requested a refill of Clobetasol 0.05% cream.  This medication has been reordered with the same quantity and end date as the last time it was prescribed.       Last Office Visit: 10/17/13  Last Physical Exam: NA      Other Med Adult:  Most Recent BP Reading(s)  10/17/13 : 120/90      Cholesterol (mg/dl)   Date  Value    07/21/453  218*   ----------  LDL DIRECT (mg/dl)   Date  Value    0/07/8118  158*   ----------  HDL (mg/dl)   Date  Value    11/19/7827  39    ----------  TRIGLYCERIDES (mg/dl)   Date  Value    03/20/2129  97    ----------      THYROID SCREEN TSH REFLEX FT4 (uIU/mL)   Date  Value    12/05/2012  0.61    ----------      TSH (THYROID STIM HORMONE) (uIU/mL)   Date  Value    10/28/2010  1.07    ----------    No results found for this basename: hgba1c      No results found for this basename: INR       Documented patient preferred pharmacies:    [facility] ([facility]  Phone: [phone] Fax: [phone]

## 2013-10-25 ENCOUNTER — Telehealth (HOSPITAL_BASED_OUTPATIENT_CLINIC_OR_DEPARTMENT_OTHER): Payer: Self-pay

## 2013-10-25 DIAGNOSIS — N83202 Unspecified ovarian cyst, left side: Secondary | ICD-10-CM

## 2013-10-25 NOTE — Progress Notes (Signed)
To our nurses:    Please call Denise Ryan aand let  her know that the ultrasound showed a cyst in the left ovary.  This is the likely cause of  Her pain.  Please let her know that these are common and usually go away on their own.  We should repeat the ultrasound in 6 weeks to be sure it goes away.    Her pain should go away soon.    Thanks,  Deeann Dowse

## 2013-10-25 NOTE — Progress Notes (Signed)
Call to pt. Lm on Vm to return RN call prn.

## 2013-12-05 ENCOUNTER — Ambulatory Visit (HOSPITAL_BASED_OUTPATIENT_CLINIC_OR_DEPARTMENT_OTHER): Payer: Self-pay

## 2014-01-22 ENCOUNTER — Telehealth (HOSPITAL_BASED_OUTPATIENT_CLINIC_OR_DEPARTMENT_OTHER): Payer: Self-pay | Admitting: Registered Nurse

## 2014-01-22 NOTE — Progress Notes (Addendum)
Call to pt.  Finally reached her!  Has been C/o dizziness, h/a and nausea since last Fri.  Afeb. No other sx.  Did home pregnancy tests and they were negative but worried if accurate.  Appt tom (per pt request) for clinical eval.

## 2014-01-22 NOTE — Telephone Encounter (Signed)
Message copied by Olevia BowensFARIA Ceili Boshers on Tue Jan 22, 2014  2:48 PM  ------       Message from: AMADO-LOUIS, LISA       Created: Tue Jan 22, 2014  2:38 PM       Regarding: sick                       Earlie LouMonica Ryan [phone], 43 year old, female, Telephone Information:       Home Phone      [phone]       Work Phone      Not on file.       Mobile          [phone]                     Patient's Preferred Pharmacy:               [facility] ([facility]       Phone: [phone] Fax: [phone]                     CONFIRMED TODAY: No              CALL BACK NUMBER: [phone]       Best time to call back:        Cell phone:        Other phone:              Available times:              Patient's language of care: TongaPortuguese [place]ian              Patient needs a TongaPortuguese interpreter.              Patient's PCP: Shelah LewandowskyAVRA GOLDMAN, MD              Person calling on behalf of patient: Patient (self)              Calls today with a sick call. Nausea, preg test negative.                        ------

## 2014-01-22 NOTE — Telephone Encounter (Signed)
Message copied by Olevia BowensFARIA Tailor Lucking on Tue Jan 22, 2014  4:40 PM  ------       Message from: Ihor DowACOSTA, ROSA       Created: Tue Jan 22, 2014  4:16 PM       Regarding: call back                       Denise Ryan [phone], 43 year old, female, Telephone Information:       Mobile          [phone]                     Patient's Preferred Pharmacy:               [facility] ([facility])       Phone: [phone] Fax: [phone]                     CONFIRMED TODAY: Yes                     Patient's language of care: Tong[place]Portuguese [place]ian              Patient needs a TongaPortuguese interpreter.              Patient's PCP: Shelah LewandowskyAVRA GOLDMAN, MD              Person calling on behalf of patient: Patient (self)              Calls today Returning phone call                       ------

## 2014-01-22 NOTE — Progress Notes (Signed)
Call to pt.  Lm on Vm to return Rn call Mon-Fri 8:30am-5pm for triage if still needed.

## 2014-01-22 NOTE — Progress Notes (Signed)
Call back to pt.  Again, LM on VM to return RN call.

## 2014-01-22 NOTE — Telephone Encounter (Signed)
Message copied by Wayne Both on Tue Jan 22, 2014  3:37 PM  ------       Message from: AMADO-LOUIS, LISA       Created: Tue Jan 22, 2014  2:52 PM       Regarding: ret call                                  Denise Ryan [id number], 43 year old, female, Telephone Information:            Home Phone      [phone]            Work Phone      Not on file.            Mobile          [phone]                                      Patient's Preferred Pharmacy:                           [facility] ([facility])            Phone: [phone] Fax: [phone]                                      CONFIRMED TODAY: No                         CALL BACK NUMBER: [phone]            Best time to call back:              Cell phone:              Other phone:                         Available times:                         Patient's language of care: Eschbach                         Patient needs a [place] interpreter.                         Patient's PCP: Peyton Najjar, MD                         Person calling on behalf of patient: Patient (self)                         Calls today with a sick call. Nausea, preg test negative.                                                  ------

## 2014-01-23 ENCOUNTER — Encounter (HOSPITAL_BASED_OUTPATIENT_CLINIC_OR_DEPARTMENT_OTHER): Payer: Self-pay | Admitting: Physician Assistant

## 2014-01-23 ENCOUNTER — Encounter (HOSPITAL_BASED_OUTPATIENT_CLINIC_OR_DEPARTMENT_OTHER): Payer: Self-pay

## 2014-01-23 ENCOUNTER — Ambulatory Visit (HOSPITAL_BASED_OUTPATIENT_CLINIC_OR_DEPARTMENT_OTHER): Payer: Medicaid Other | Admitting: Physician Assistant

## 2014-01-23 ENCOUNTER — Ambulatory Visit (HOSPITAL_BASED_OUTPATIENT_CLINIC_OR_DEPARTMENT_OTHER): Payer: Medicaid Other

## 2014-01-23 VITALS — BP 112/70 | HR 73 | Temp 97.2°F | Resp 16 | Wt 169.0 lb

## 2014-01-23 DIAGNOSIS — R42 Dizziness and giddiness: Secondary | ICD-10-CM

## 2014-01-23 DIAGNOSIS — Z3009 Encounter for other general counseling and advice on contraception: Secondary | ICD-10-CM

## 2014-01-23 LAB — URINE PREGNANCY TEST (POINT OF CARE): HCG QUALITATIVE URINE: NEGATIVE

## 2014-01-23 MED ORDER — MECLIZINE HCL 25 MG PO TABS
25.00 mg | ORAL_TABLET | Freq: Three times a day (TID) | ORAL | Status: AC | PRN
Start: 2014-01-23 — End: 2014-01-30

## 2014-01-23 MED ORDER — ONDANSETRON HCL 4 MG PO TABS
4.0000 mg | ORAL_TABLET | Freq: Three times a day (TID) | ORAL | Status: AC | PRN
Start: 2014-01-23 — End: 2014-01-30

## 2014-01-23 NOTE — Progress Notes (Signed)
Denise Ryan is a 43 year old female who presents with 2 days of vomiting and nausea. Took an at home pregnancy test which was negative. It is worse with head movement and feels like she is swaying and may fall down with a spinning sensation. Is trying to get pregnant but has been unsuccessful. Recent pregnancy tests were negative.        Review of Systems   Constitutional: Negative.  Negative for fever and chills.   HENT: Negative.  Negative for hearing loss and tinnitus.    Respiratory: Negative.    Gastrointestinal: Positive for nausea and vomiting. Negative for heartburn, abdominal pain and diarrhea.   Musculoskeletal: Negative.    Skin: Negative.    Neurological: Positive for dizziness. Negative for tingling, tremors, sensory change, focal weakness and headaches.   Psychiatric/Behavioral: Negative.      Physical Exam   Vitals reviewed.  Constitutional: She is oriented to person, place, and time. She appears well-developed and well-nourished. No distress.   HENT:   Head: Normocephalic and atraumatic.   Right Ear: Hearing, tympanic membrane, external ear and ear canal normal.   Left Ear: Tympanic membrane, external ear and ear canal normal. Decreased hearing is noted.   Mouth/Throat: Uvula is midline and oropharynx is clear and moist.   Left ear is impacted with cerumen    Eyes: Conjunctivae are normal.   Cardiovascular: Normal rate, regular rhythm and normal heart sounds.    Pulmonary/Chest: Effort normal and breath sounds normal.   Musculoskeletal: Normal range of motion.   Neurological: She is alert and oriented to person, place, and time.   Skin: Skin is warm and dry. She is not diaphoretic.   Psychiatric: She has a normal mood and affect. Her behavior is normal. Judgment and thought content normal.     BP 112/70  Pulse 73  Temp(Src) 97.2 F (36.2 C) (Temporal)  Resp 16  Wt 169 lb (76.658 kg)  BMI 28.99 kg/m2  SpO2 99%  LMP 01/08/2014    (780.4) Vertigo  (primary encounter diagnosis)  Comment:      Plan: Likely underlying BBPV or labyrinthitis. Will use meclizine and zofran for symptom control. If persists greater than 1 week will need f/u with ENT. Asked to call. HCG Negative.     I have reviewed the past medical, surgical, social and family history and updated these sections of EpicCare as relevant. All interim labs, test results, and consult notes were reviewed and discussed with Denise Ryan. Medications were reconciled during this visit and a current medication list was given to the patient at the end of the visit.

## 2014-01-23 NOTE — Progress Notes (Signed)
.    Reason for Visit: Patient is here for pregnancy test.    Confidential Visit: no  Safe or Confidential Phone #: no  Preferred Name: no    Medications and Allergies reviewed.     GYN:  Patient's last menstrual period was 01/08/2014.  Regular Cycles?  regular    #days: 3-4 days   Cramping: sometimes  Obstetric History   G1  P1  T1  P0  A0  TAB0  SAB0  E0  M0  L1     Pregnancy Desired: no  Last GYN Exam (date/result): 04/2011    Since LMP:  Bleeding:  No  Discharge:  No  Cramps:  No  Abdominal Pain: No  STI Exposure: No    Symptoms of Pregnancy?  Yes, nausea  Support Systems? 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  School: no    Sexual History:    Sexual Activity: Yes   Partners: Male   CopyBirth Control/ Protection: Pill   Comment: Diane 35 from [place]      Current Partner(s): Husband for 5 years  Current Sexual Activity: vaginal  Satisfaction with Current Method: Yes, seeking pregnancy  Side Effects/Problems: No  Using Method Correctly: Yes    Date of Last Sexual Activity: last week  Protection Used: no  EC Need: no  Condom Use:  Sometimes  Condom Breakage: No  Condom Instruction Given: yes    Sexual Concerns or Questions: no    History of STIs:    Tested Before?:  No  Testing Offered: Yes  Testing Accepted: No   -Disc: Transmission (Blood, Semen, Vaginal Fluid and Breastmilk), Risky Behaviors, Non-Risks/Myths,  Seroconversion Period , Result Waiting Time, Possible Results, Feelings Surrounding Possible Results, Plans for Informing of Results, Support Structures and Hep C Risk.    DV Assessment:  Hx of DV/IP Abuse:  No  Hx of Sexual Assault:  No  Sexual Coercion: No  Birth Control Coercion:No  Received Money, Food, Shelter, or Drugs for Sex: No    Education/Counseling:  Contraceptive overview: Risks, Benefits, Warning Signs, Effectiveness, Side Effects, Instructions, Follow Up  STD/HIV Risk Assessment  Done  STD/HIV prevention/safer sex discussed    Plan:  Pregnancy Test Result:  Negative      Assessment: Family Planning    Plan: Education/Counseling:  preconception counseling  STD/HIV risk assessment  STD/HIV prevention/safer sex discussion  Other Testing Done: No  Prescriptions Done: No      Referrals: no    Follow Up Plan:   Patient came here for pregnancy test. She has having some nausea and dizziness lately.  She has been seeking pregnancy. She is married and a 43 yo daughter.  Pregnancy test performed with negative result.   I discussed with patient about fertility awareness methods.   Suggested her to return to the Clinic in case she has any questions or wants to repeat pregnancy test. She agreed.    Dois DavenportSandra [facility],HC

## 2014-01-23 NOTE — Patient Instructions (Signed)
Labyrinthitis (Inner Ear Inflammation) °Your exam shows you have an inner ear disturbance or labyrinthitis. The cause of this condition is not known. But it may be due to a virus infection. The symptoms of labyrinthitis include vertigo or dizziness made worse by motion, nausea and vomiting. The onset of labyrinthitis may be very sudden. It usually lasts for a few days and then clears up over 1-2 weeks. °The treatment of an inner ear disturbance includes bed rest and medications to reduce dizziness, nausea, and vomiting. You should stay away from alcohol, tranquilizers, caffeine, nicotine, or any medicine your doctor thinks may make your symptoms worse. Further testing may be needed to evaluate your hearing and balance system. Please see your doctor or go to the emergency room right away if you have: °· Increasing vertigo, earache, loss of hearing, or ear drainage. °· Headache, blurred vision, trouble walking, fainting, or fever. °· Persistent vomiting, dehydration, or extreme weakness. °Document Released: 11/01/2005 Document Revised: 01/24/2012 Document Reviewed: 04/19/2007 °ExitCare® Patient Information ©2014 [employer]. ° °

## 2014-01-30 ENCOUNTER — Ambulatory Visit (HOSPITAL_BASED_OUTPATIENT_CLINIC_OR_DEPARTMENT_OTHER): Payer: Self-pay

## 2014-01-30 DIAGNOSIS — N83209 Unspecified ovarian cyst, unspecified side: Secondary | ICD-10-CM

## 2014-01-30 LAB — US TRANSVAGINAL NON-OB

## 2014-01-30 LAB — US 3D RENDERING WO DEDICATED WORKSTATION

## 2014-01-30 LAB — US PELVIC NON-PREGNANT

## 2014-01-31 ENCOUNTER — Telehealth (HOSPITAL_BASED_OUTPATIENT_CLINIC_OR_DEPARTMENT_OTHER): Payer: Self-pay

## 2014-01-31 DIAGNOSIS — R42 Dizziness and giddiness: Secondary | ICD-10-CM

## 2014-01-31 NOTE — Progress Notes (Signed)
Called pt no answer left message to call Denise Jividen,rn at [phone]

## 2014-01-31 NOTE — Progress Notes (Signed)
To our nurses.    Please call Pt to let her know that her repeat ultrasound looks good and the cyst is almost completely gone.   If she is still having signficant pain she should call so one of us can see her again, otherwise , there is nothing to worry about.    Thanks,  Deeann DowseAvra

## 2014-02-01 DIAGNOSIS — R42 Dizziness and giddiness: Secondary | ICD-10-CM | POA: Insufficient documentation

## 2014-02-01 NOTE — Progress Notes (Signed)
I will place a referral for ENT today. It sometimes takes time to get into to see them. If she has worsening problems would recommend being seen next week.

## 2014-02-01 NOTE — Progress Notes (Signed)
Spoke with patient  Relayed results   She is still very dizzy  Medications that marlene gave her didn't help  To two, daily for over a week  Dizzy all the time, mostly moving head side to side  Was told she may need to see specialist  RN to notify provider    Roddie McMarlene still with dizziness, was the plan to see ENT?  Would like that refferal  Thanks  Darl PikesSusan

## 2014-02-01 NOTE — Telephone Encounter (Signed)
Message copied by Johnette AbrahamSWETT, Marcea Rojek on Fri Feb 01, 2014  9:46 AM  ------       Message from: Murvin DonningFERREIRA, AILTON       Created: Fri Feb 01, 2014  9:40 AM       Regarding: speak to the nurse                       Earlie LouMonica Massi [phone], 43 year old, female, Telephone Information:       Home Phone      [phone]       Work Phone      Not on file.       Mobile          [phone]                     Patient's Preferred Pharmacy:               [facility] ([facility]       Phone: [phone] Fax: [phone]                     CONFIRMED TODAY: Lovett SoxYes              CALL BACK NUMBER:  [phone]              Best time to call back:        Cell phone:        Other phone:              Available times:              Patient's language of care: TongaPortuguese [place]ian              Patient needs a TongaPortuguese interpreter.              Patient's PCP: Shelah LewandowskyAVRA GOLDMAN, MD              Person calling on behalf of patient: Patient (self)              Calls today to speak to nurse  Regarding meds meclizine (ANTIVERT) 25 MG TABS              Thanks                              ------

## 2014-02-12 ENCOUNTER — Telehealth (HOSPITAL_BASED_OUTPATIENT_CLINIC_OR_DEPARTMENT_OTHER): Payer: Self-pay | Admitting: Registered Nurse

## 2014-02-12 NOTE — Progress Notes (Signed)
Call to pt.  Still dizzy.  Has not heard back from us.  "I am still so dizzy. Wondering if I should go to the ER!"  Per note, ENT referral placed. Pt did not receive this info.  Transferred to Hollice GongLucy Coutinho to book appt.  Advised to see provider in interim if appt not for a while or if sx worsen.  She agrees.

## 2014-02-12 NOTE — Telephone Encounter (Signed)
Message copied by FARIA Female Minish on TueOlevia Bowens Feb 12, 2014  1:45 PM  ------       Message from: Murvin DonningFERREIRA, AILTON       Created: Tue Feb 12, 2014  1:11 PM       Regarding: sick call - dizziness                Call back # [phone]              Interpreter:no              Person Calling:patient              Reason for todays call?: sick call                     Sick call: dizziness              Symptoms:              How long have you been sick?: 3 weeks . Pt already saw PA Hundemer for this problem.              Thanks                                   Reason to speak with a nurse only?              Reason to speak with a provider only?                            Other call: Reason for call?                 ------

## 2014-02-21 ENCOUNTER — Ambulatory Visit (HOSPITAL_BASED_OUTPATIENT_CLINIC_OR_DEPARTMENT_OTHER): Payer: Medicaid Other | Admitting: Otolaryngology

## 2014-02-21 ENCOUNTER — Encounter (HOSPITAL_BASED_OUTPATIENT_CLINIC_OR_DEPARTMENT_OTHER): Payer: Self-pay | Admitting: Otolaryngology

## 2014-02-21 VITALS — BP 119/82 | HR 70 | Temp 97.8°F

## 2014-02-21 DIAGNOSIS — R42 Dizziness and giddiness: Secondary | ICD-10-CM

## 2014-02-21 DIAGNOSIS — H9192 Unspecified hearing loss, left ear: Secondary | ICD-10-CM

## 2014-02-21 NOTE — Progress Notes (Signed)
I have seen and examined the patient.  I agree with the findings and recommendations of the Physician Assistant Joanna Gross.    Hank Walling N. Joanie Duprey, MD, MBA  Otolaryngology- Head and Neck Surgery

## 2014-02-21 NOTE — Progress Notes (Signed)
HPI: 43 year old female Denise Ryan is complaining of dizziness x 1 month.    TongaPortuguese interpreter.    About every 20 minutes or so she gets a brief dizzy spell lasting a few seconds.  Initially it was more severe and describes a spinning sensation and nausea, but symptoms are becoming milder and now more like off-balance.  It resolves when sitting still.  She thinks it is associated with turning her head to the left quickly.  She also has a sensation of decreased hearing on the left side.  She denies tinnitus or ear infections.  She is a non-smoker.  She was taking Meclizine without improvement so stopped it.      Past medical, social and family history reviewed in EPIC.    Review of Systems -   Fever No  Cough No  Easy Bruising No  Blurred Vision No  Palpitations No  Heartburn No  Stiff Neck No  Headache No  Skin Lesions of the Face No    EXAM  General: WD WN female with a normal voice.  Face: No lesions.  Facial Strength: Normal and symmetric.  Eyes: PERRLA and EOMI.  Ear R: No lesions. Cerumen: none / minimal. Canal clear. TM clear.   Ear L:  No lesions. Cerumen: impaction cleaned. Canal clear. TM clear.  Nose:  Septum midline.  Turbinates normal size.  No polyps or masses.  Nasopharynx:  CNV    Oral Cavity/Oropharynx:  Mucous membranes moist.  Tonsils 0+.  Teeth in good condition.  Tongue no lesions.  Hypopharynx: Base of tongue no masses. Pyriform sinuses clear.  Larynx: Epiglottis no edema or lesions. Vocal cords: CNV.  Neck:  Trachea midline.  No palpable masses.  Thyroid: Normal to palpation.  Lymph:  No palpable nodes.  Subman glands:  Normal size, non-tender.  Parotids: Normal size, no masses or nodes.    Dix-Hallpike: Negative for nystagmus or reproduction of symptoms.    A/P  43 year old female with recurrent brief vertigo.  History is consistent with BPPV but negative dix-hallpike.  May represent resolving BPPV and we discussed that this does tend to resolve with time.  Epley not  likely to be helpful in context of negative Dix-Hallpike.    Hearing evaluation WNL and left hearing loss was likely cerumen related.    Discussed options of observation as this will likely improve on its own vs vestibular testing.  She has opted for observation and will return in 1 month.  Call earlier if sx worsen.

## 2014-02-21 NOTE — Progress Notes (Signed)
History:  Denise Ryan is a 43 year old female who reports dizziness that started a month ago. She also reports decreased hearing in the left ear and that the hearing improved after cerumen removal earlier this morning.     Impressions:  Tympanometry is consistent with normal TM mobility and normal middle ear pressure in both ears. Pure tone testing revealed normal hearing bilaterally. WRS Excellent in both ears.    Recommendation:  Re-evaluation upon Physician's request.

## 2014-03-01 ENCOUNTER — Ambulatory Visit (HOSPITAL_BASED_OUTPATIENT_CLINIC_OR_DEPARTMENT_OTHER): Payer: Self-pay

## 2014-03-01 ENCOUNTER — Encounter (HOSPITAL_BASED_OUTPATIENT_CLINIC_OR_DEPARTMENT_OTHER): Payer: Self-pay

## 2014-03-01 VITALS — BP 110/70 | HR 77 | Temp 98.4°F | Resp 16 | Ht 64.0 in | Wt 174.0 lb

## 2014-03-01 DIAGNOSIS — R42 Dizziness and giddiness: Secondary | ICD-10-CM

## 2014-03-01 DIAGNOSIS — Z6379 Other stressful life events affecting family and household: Secondary | ICD-10-CM

## 2014-03-01 NOTE — Progress Notes (Signed)
SUBJECTIVE:  Denise Ryan is a 43 year old female who presents f/up on dizziness.  First felt dizzy 2 mos ago.  Saw ENT wh otold her eerything was OK.    Still coming frequently.  Lasts about 4-5 secs each time, but can have it up to 20x an hour.        First happened with lying down or turning over inbed.    Still happens sometimes when she gets up or lies down quickly.    Still happening several times a day.    Very worrisome for her.      Was voimting at first too.  This only occurred on first day and did not return.      Took Meclizine but did not help.     Feelsa as though R hand is "shrinking" when she gets dizzy.  Can use it fully.  Feels as though her arm is turning in.        Traveled to [place] in Dec, 2014 and saw family an dhad, was hard work.  Still stressed, lots of problems with her brothers    Business is going well.     Sometimes wants to cry.  Will then run so no one will see her cry.   Anything will make her cry.      ROS:   General:  --slepeing well  ENT:  A little stuffy now, did have pressure over L eye at the beginning but then resolved, no bad congestion now  Neck:  No pain  GI:  No abd pain , no N or V  Neuro:  No more h/as  Psych:  Despite stress, mood is pretty good, enjoys her work, time with Actor, time with husband      Past Medical History    Other advanced maternal age in pregnancy     HTN (hypertension)     Comment: Labile Blood pressures no medications    Down's syndrome     Comment: Cousin : Mother 4     UTI (lower urinary tract infection) 05/01/2008    Kidney stone 04/10/2008       Social History   Marital Status: Married  Spouse Name: N/A    Years of Education: N/A  Number of Children: N/A     Occupational History  None on file     Social History Main Topics   Smoking status: Former Smoker     Smokeless tobacco: Never Used    Comment: 1 cig /week     Alcohol Use: Yes    Comment: socially     Drug Use: No    Sexual Activity: Yes    Partners: Male    Herbalist: Pill    Comment: Diane 35 from [place]      Other Topics Concern    Financial planner No    Blood Transfusions No    Caffeine Concern No    Occupational Exposure No    Hobby Hazards No    Sleep Concern No    Stress Concern No    Weight Concern No    Special Diet No    Back Care No    Exercise No    Bike Helmet No    Seat Belt No    Self-Exams No     Social History Narrative        Patient is from [place]. 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 Seeking pregnancy. 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                               MEDICATIONS:, some burning feeling in L arm  Psych:  Having lots of stress,     Current Outpatient Prescriptions:  Prenatal Vit-Fe Fumarate-FA (PRENATAL MULTIVITAMIN) 27-0.8 MG TABS Take 1 tablet by mouth daily. Disp: 30 tablet Rfl: 11     No current facility-administered medications for this visit.    ALLERGIES:  Review of Patient's Allergies indicates:   Norfloxacin             Hives    OBJECTIVE:      PHYSICAL EXAM:  Smiling, looks well NAD   03/01/14  1050   BP: 110/70   Pulse: 77   Temp: 98.4 F (36.9 C)   TempSrc: Temporal   Resp: 16   Height: 5\' 4"  (1.626 m)   Weight: 174 lb (78.926 kg)   SpO2: 95%     HEENT: Normocephalic, Atraumatic, Bilateral external ears normal, Oropharynx moist, No oral exudates, Nose normal, PERRLA., no nystagmus  Cardiovascular: Normal heart rate, Normal rhythm, No murmurs, No rubs, No gallops.   Thorax & Lungs: Normal breath sounds, No respiratory distress, No wheezing, No chest tenderness.   Abdomen: Soft, Non tender, No guarding, No masses, Normal bowel sounds.  Extremities: Intact distal pulses, No edema.  Neurologic:  No focal deficits noted.  Mild dizzy sx reported when I had her lie down, turn head, sit up quickly  Psych:  Cheerful, pleasant, smiling, no tearfulness or  evidence of anxiety      ASSESSMENT/  PLAN:  Otherwise healthy 43 yo woman    (780.4) Dizziness  (primary encounter diagnosis)  Comment: Sx began 2 mos ago after strong L sided h/a and were associated with N, vomiting x1 and vertigo.   Now with much milder vertigo sx, no h/as, no N or V.    Is getting mild sx often , which is what concerned her.  Has been getting lots of advice from friends and family telling her that something very serious could be cause (cancer, heart problems, DM)  Benign positional vertigo remains most likely dx with easing sx, but cannot be sure at this time  Plan: Has ENT f/up and I will defer to this provider    (V61.09) Stressful life event affecting family  Comment: Reports increased stress related to family. Her brother and his family are all living with Pt and her husband and [name] and this has been tough.  May play a role, may not.  Plan: Check on this next time.    Has ENT f/up in 1 month.    Call before with any worsening of sx.      I have spent 25 minutes in face to face time with this patient/patient proxy of which > 50% was in counseling or coordination of care regarding above issues/Dx.              1. The patient indicates understanding of these issues and agrees with the plan.  2.  The patient is given an After Visit Summary sheet that lists all of their medications with directions, their allergies, orders placed during this encounter,  immunization dates, and follow- up instructions.  3. I reviewed the patient's medical information and medical history   4.  I reconciled the patient's medication list and prepared and supplied needed refills.  5.  I have reviewed the past medical, family, and social history sections including the medications and allergies listed in the above medical record      Electronically signed by: Shelah LewandowskyAVRA Shayne Diguglielmo, MD, 03/01/2014 11:28 AM  This note is electronically signed in the electronic medical record.

## 2014-03-01 NOTE — Progress Notes (Signed)
patient safe at home

## 2014-03-28 ENCOUNTER — Ambulatory Visit (HOSPITAL_BASED_OUTPATIENT_CLINIC_OR_DEPARTMENT_OTHER): Payer: Medicaid Other | Admitting: Otolaryngology

## 2014-04-02 ENCOUNTER — Encounter (HOSPITAL_BASED_OUTPATIENT_CLINIC_OR_DEPARTMENT_OTHER): Payer: Self-pay

## 2014-10-23 ENCOUNTER — Encounter (HOSPITAL_BASED_OUTPATIENT_CLINIC_OR_DEPARTMENT_OTHER): Payer: Self-pay

## 2014-10-23 ENCOUNTER — Ambulatory Visit (HOSPITAL_BASED_OUTPATIENT_CLINIC_OR_DEPARTMENT_OTHER): Payer: Medicaid Other

## 2014-10-23 VITALS — BP 110/80 | HR 70 | Temp 97.5°F | Wt 179.0 lb

## 2014-10-23 DIAGNOSIS — IMO0002 Reserved for concepts with insufficient information to code with codable children: Secondary | ICD-10-CM

## 2014-10-23 DIAGNOSIS — N979 Female infertility, unspecified: Secondary | ICD-10-CM

## 2014-10-23 DIAGNOSIS — R5381 Other malaise: Secondary | ICD-10-CM

## 2014-10-23 DIAGNOSIS — R5383 Other fatigue: Secondary | ICD-10-CM

## 2014-10-23 LAB — CBC, PLATELET & DIFFERENTIAL
ABSOLUTE BASO COUNT: 0 10*3/uL (ref 0.0–0.1)
ABSOLUTE EOSINOPHIL COUNT: 0.2 10*3/uL (ref 0.0–0.8)
ABSOLUTE IMM GRAN COUNT: 0.02 10*3/uL (ref 0.00–0.03)
ABSOLUTE LYMPH COUNT: 2.8 10*3/uL (ref 0.6–5.9)
ABSOLUTE MONO COUNT: 0.6 10*3/uL (ref 0.2–1.4)
ABSOLUTE NEUTROPHIL COUNT: 4.3 10*3/uL (ref 1.6–8.3)
BASOPHIL %: 0.5 % (ref 0.0–1.2)
EOSINOPHIL %: 2.4 % (ref 0.0–7.0)
HEMATOCRIT: 41.7 % (ref 34.1–44.9)
HEMOGLOBIN: 13.5 g/dL (ref 11.2–15.7)
IMMATURE GRANULOCYTE %: 0.3 % (ref 0.0–0.4)
LYMPHOCYTE %: 35.7 % (ref 15.0–54.0)
MEAN CORP HGB CONC: 32.4 g/dL (ref 31.0–37.0)
MEAN CORPUSCULAR HGB: 28.8 pg (ref 26.0–34.0)
MEAN CORPUSCULAR VOL: 88.9 fL (ref 80.0–100.0)
MEAN PLATELET VOLUME: 11 fL (ref 8.7–12.5)
MONOCYTE %: 7 % (ref 4.0–13.0)
NEUTROPHIL %: 54.1 % (ref 40.0–75.0)
PLATELET COUNT: 229 10*3/uL (ref 150–400)
RBC DISTRIBUTION WIDTH STD DEV: 43.2 fL (ref 35.1–46.3)
RBC DISTRIBUTION WIDTH: 13.4 % (ref 11.5–14.3)
RED BLOOD CELL COUNT: 4.69 M/uL (ref 3.90–5.20)
WHITE BLOOD CELL COUNT: 7.9 10*3/uL (ref 4.0–11.0)

## 2014-10-23 LAB — VITAMIN D,25 HYDROXY: VITAMIN D,25 HYDROXY: 19 ng/mL — CL (ref 30.0–100.0)

## 2014-10-23 LAB — THYROID SCREEN TSH REFLEX FT4: THYROID SCREEN TSH REFLEX FT4: 1.33 u[IU]/mL (ref 0.358–3.740)

## 2014-10-23 NOTE — Progress Notes (Signed)
S:   43 yo woman with recurrence of pain with sex.  Seems to come and go. Hurt 1x last week, then did not hurt the other time.  Can be very strong.   They are getting upset about it.    When he is deep inside.   Periods are fine.  Very regular, periods are heavy with cramping--this is a change.  This change happened about 5 or 6 mos ago.    No hcange in d/c.  Comes and goes.    Not using any FP.    Has menses today.      ROS:  General:  Sleeping well, appetite good, no fevers  GI:  No abd pain or trouble with stools  GU:  No vag d/c or itch or irirtation, no dysuria  MS:  No bad joint pain  Psych:  Mood is good  Endo:  No polyuria or polydypsia, no heat or cold intolerance          Past Medical History    Other advanced maternal age in pregnancy     HTN (hypertension)     Comment: Labile Blood pressures no medications    Down's syndrome     Comment: Cousin : Mother [id number]     UTI (lower urinary tract infection) 05/01/2008    Kidney stone 04/10/2008         Social History   Marital Status: Married  Spouse Name: N/A    Years of Education: N/A  Number of Children: N/A     Occupational History  None on file     Social History Main Topics   Smoking status: Former Smoker     Smokeless tobacco: Never Used    Comment: 1 cig /week     Alcohol Use: Yes    Comment: socially     Drug Use: No    Sexual Activity: Yes    Partners: Male    CopyBirth Control/ Protection: Pill    Comment: Diane 35 from [place]      Other Topics Concern    Financial plannerMilitary Service No    Blood Transfusions No    Caffeine Concern No    Occupational Exposure No    Hobby Hazards No    Sleep Concern No    Stress Concern No    Weight Concern No    Special Diet No    Back Care No    Exercise No    Bike Helmet No    Seat Belt No    Self-Exams No     Social History Narrative        Patient is from [place]. Lives with her husband and daughter, [name], born in 12/12    Runs a [place] [employer]    No tobacco, safe at home        01/23/14    Patient lives  with her husband and 43 yo daughter. Seeking pregnancy. 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                               PE:  BP 110/80 mmHg   Pulse 70   Temp(Src) 97.5 F (36.4 C) (Temporal)   Wt 81.194 kg (179 lb)   SpO2 98%  General:  Appears well NAD  Skin:  No rash, no lesions  HEENT:  Mucous membranes moist, PERRL, Nose:  No congestion, Mouth:  No lesions  Neck:  Supple, no mass or thyroidmegaly  Lungs:  Clear  Cor:  S1S2 reg no M no S3 or S4  Abdomen:  Non-distended, BS+, soft, no mass or tenderness, no organomegaly  Ext:  No CCE, no deformities, full ROM  Neuro:  Nl gait  Psych:  Full affect, no agitation      A/P:  43 yo woman    (N94.1) Dyspareunia  (primary encounter diagnosis)  Comment: Off and on now for several months.  No clear pattern to the pain, although it does happen only on deep penetration.  Periods have gotten heavier and crampier.  Has had several recent pelvic ultrasounds that show no problems in uterus or vagina.    Not clear why this is happening.  Plan: REFERRAL TO GYNECOLOGY (INT)            (N97.9) Female infertility  Comment: Has been attempting conception for last 2 years w/o luck.    Plan: REFERRAL TO GYNECOLOGY (INT)            (R53.81,  R53.83) Malaise and fatigue  Comment: just feeling tired despite sleeping well and with good mood  Plan: CBC + PLT + AUTO DIFF, THYROID SCREEN TSH         REFLEX FT4, HEMOGLOBIN A1C, VITAMIN D,25         HYDROXY, COLLECTION VENOUS BLOOD VENIPUNCTURE            I will contact her with results.

## 2014-10-24 LAB — HEMOGLOBIN A1C
ESTIMATED AVERAGE GLUCOSE: 103 (ref 74–160)
HEMOGLOBIN A1C: 5.2 % (ref 4.0–5.6)

## 2014-10-25 ENCOUNTER — Other Ambulatory Visit (HOSPITAL_BASED_OUTPATIENT_CLINIC_OR_DEPARTMENT_OTHER): Payer: Self-pay

## 2014-10-25 ENCOUNTER — Telehealth (HOSPITAL_BASED_OUTPATIENT_CLINIC_OR_DEPARTMENT_OTHER): Payer: Self-pay

## 2014-10-25 ENCOUNTER — Other Ambulatory Visit (HOSPITAL_BASED_OUTPATIENT_CLINIC_OR_DEPARTMENT_OTHER): Payer: Self-pay | Admitting: Physician Assistant

## 2014-10-25 DIAGNOSIS — E569 Vitamin deficiency, unspecified: Secondary | ICD-10-CM

## 2014-10-25 DIAGNOSIS — Z0000 Encounter for general adult medical examination without abnormal findings: Secondary | ICD-10-CM

## 2014-10-25 MED ORDER — ERGOCALCIFEROL 1.25 MG (50000 UT) PO CAPS
50000.00 [IU] | ORAL_CAPSULE | ORAL | Status: AC
Start: 2014-10-25 — End: 2014-12-20

## 2014-10-25 NOTE — Progress Notes (Signed)
PER Pharmacy, Denise Ryan is a 43 year old female has requested a refill of vol-tab rx.      Last Office Visit: 10/23/14  Last Physical Exam: 03/01/14      Other Med Adult:  Most Recent BP Reading(s)  10/23/14 : 110/80        CHOLESTEROL (mg/dl)   Date Value   23/55/732202/02/2010 218*   ----------    LOW DENSITY LIPOPROTEIN DIRECT (mg/dl)   Date Value   02/54/270602/02/2010 158*   ----------    HIGH DENSITY LIPOPROTEIN (mg/dl)   Date Value   23/76/283102/02/2010 39   ----------    TRIGLYCERIDES (mg/dl)   Date Value   51/76/160702/02/2010 97   ----------        THYROID SCREEN TSH REFLEX FT4 (uIU/mL)   Date Value   10/23/2014 1.330   ----------        TSH (THYROID STIM HORMONE) (uIU/mL)   Date Value   10/28/2010 1.07   ----------      HEMOGLOBIN A1C (%)   Date Value   10/23/2014 5.2   ----------      No results found for: INR       Documented patient preferred pharmacies:    [facility] ([facility]  Phone: [phone] Fax: [phone]

## 2014-10-25 NOTE — Progress Notes (Signed)
PER Pharmacy, Denise Ryan is a 43 year old female has requested a refill of loratadine 10 mg.      Last Office Visit: 10/23/14  Last Physical Exam: 03/01/14      Other Med Adult:  Most Recent BP Reading(s)  10/23/14 : 110/80        CHOLESTEROL (mg/dl)   Date Value   16/10/960402/02/2010 218*   ----------    LOW DENSITY LIPOPROTEIN DIRECT (mg/dl)   Date Value   54/09/811902/02/2010 158*   ----------    HIGH DENSITY LIPOPROTEIN (mg/dl)   Date Value   14/78/295602/02/2010 39   ----------    TRIGLYCERIDES (mg/dl)   Date Value   21/30/865702/02/2010 97   ----------        THYROID SCREEN TSH REFLEX FT4 (uIU/mL)   Date Value   10/23/2014 1.330   ----------        TSH (THYROID STIM HORMONE) (uIU/mL)   Date Value   10/28/2010 1.07   ----------      HEMOGLOBIN A1C (%)   Date Value   10/23/2014 5.2   ----------      No results found for: INR       Documented patient preferred pharmacies:    [facility] ([facility])  Phone: [phone] Fax: [phone]

## 2014-10-25 NOTE — Progress Notes (Signed)
spoke to the patient regarding VIT D results, and directions/ pick up script.  Pt agrees with plan. Pt also requesting prenatal vitamins   (previous script expired). Pt is trying to conceive. Message to   Dr Elonda Huskygoldman for script Auth.  Pt denies any other questions or concerns.

## 2014-10-25 NOTE — Progress Notes (Signed)
To our nurses:    Please call Pt and let her know that all her blood tests were good except her Vitamin D which is a little bit low.    I am sending a Rx for this to her pharmacy.  She should take it once weekly x 8 weeks and then switchi to once daily 600 mequiv Vit D OTC.    Thanks,  Deeann DowseAvra

## 2014-10-28 MED ORDER — PRENATAL PLUS/IRON 27-1 MG PO TABS
1.00 | ORAL_TABLET | Freq: Every day | ORAL | Status: AC
Start: 2014-10-28 — End: 2014-11-27

## 2014-10-28 NOTE — Addendum Note (Signed)
Addended by: Shelah LewandowskyGOLDMAN, Geneva Pallas on: 10/28/2014 08:59 AM     Modules accepted: Orders

## 2014-10-28 NOTE — Progress Notes (Signed)
See other Rx for prenatal vits already signed.

## 2014-12-16 ENCOUNTER — Telehealth (HOSPITAL_BASED_OUTPATIENT_CLINIC_OR_DEPARTMENT_OTHER): Payer: Self-pay | Admitting: Registered Nurse

## 2014-12-16 NOTE — Progress Notes (Signed)
Call to pt.  Unable to LM on VM as "mailbox is full".

## 2014-12-16 NOTE — Telephone Encounter (Signed)
-----   Message from Lorrene ReidMirna I. Rodriguez sent at 12/16/2014  3:07 PM EST -----  Regarding: tirage/appt change  Hi     Dr. Allyson SabalBerry would like for this patient to be triage. Patient has an appt for this Thursday 12/19/14 for pain with sex and infertility. Dr. Allyson SabalBerry [facility] and for infertility she will need IVF.     Thank you

## 2014-12-18 ENCOUNTER — Telehealth (HOSPITAL_BASED_OUTPATIENT_CLINIC_OR_DEPARTMENT_OTHER): Payer: Self-pay

## 2014-12-18 ENCOUNTER — Telehealth (HOSPITAL_BASED_OUTPATIENT_CLINIC_OR_DEPARTMENT_OTHER): Payer: Self-pay | Admitting: Registered Nurse

## 2014-12-18 NOTE — Telephone Encounter (Signed)
-----   Message from [facility] sent at 12/17/2014  9:27 PM EST -----  Regarding: RE: tirage/appt change  It looks like in this message, Dr. Allyson SabalBerry wanted a patient to be triaged and then [facility] that is currently on his sched for 2/4?  Andrzej Scully this was booked by Luiz OchoaMariza, do you why Dr. Allyson SabalBerry wants it triaged and moved?    I don't see a patient listed above, but it would be good to get this resolved, so the patient can make plans to change their schedule if needed.    SH      ----- Message -----     From: Elouise MunroeSara Furtado     Sent: 12/16/2014   3:19 PM       To: Christ KickPaulette Melanson, Berneice GandyShelonda Hannah  Subject: FW: tirage/appt change                           Paulette:  I have received 2 patient calls from the ob/gyn staff asking to r/s appts for Dr Allyson SabalBerry for this Thu. Neither was booked by nurses, but by the obgyn staff.  I have a full basket with triage calls and f/u patient calls.   Thank you  ----- Message -----     From: Lorrene ReidMirna I. Bauer Ausborn     Sent: 12/16/2014   3:07 PM       To: Ec Rn Pool  Subject: tirage/appt change                               Hi     Dr. Allyson SabalBerry would like for this patient to be triage. Patient has an appt for this Thursday 12/19/14 for pain with sex and infertility. Dr. Allyson SabalBerry states [facility] the patient and for infertility she will need IVF.     Thank you

## 2014-12-18 NOTE — Progress Notes (Signed)
Left a message for the patient via telephone interpreter  [facility] [phone]. Call to triage pt and confirm pts appt   On 2/12 with Phary  Await a return call.

## 2014-12-18 NOTE — Progress Notes (Signed)
what happend was that on Dr. Allyson SabalBerry schedule there were 6 new 30 minutes patient schedule in the afternoon. Dr. Allyson SabalBerry asked to triage two of the patients just to rule out if there is an urgency to be seen that day and if patient can be seen by a midwife. On every provider schedule there is three or four new patients per session, having 6 is a lot and that most likely will make us run late, plus most of our patients are portuguese speaking which requires an interpreter that right there makes us run late already. Mariza or myself didn't feel comfortable triaging the patient, this is why message was routed to nurse. Patient was schedule to see a midwife.     Thank you

## 2014-12-19 ENCOUNTER — Ambulatory Visit (HOSPITAL_BASED_OUTPATIENT_CLINIC_OR_DEPARTMENT_OTHER): Payer: PRIVATE HEALTH INSURANCE | Admitting: Obstetrics & Gynecology

## 2014-12-27 ENCOUNTER — Telehealth (HOSPITAL_BASED_OUTPATIENT_CLINIC_OR_DEPARTMENT_OTHER): Payer: Self-pay

## 2014-12-27 ENCOUNTER — Ambulatory Visit (HOSPITAL_BASED_OUTPATIENT_CLINIC_OR_DEPARTMENT_OTHER): Payer: PRIVATE HEALTH INSURANCE | Admitting: Advanced Practice Midwife

## 2014-12-27 ENCOUNTER — Encounter (HOSPITAL_BASED_OUTPATIENT_CLINIC_OR_DEPARTMENT_OTHER): Payer: Self-pay | Admitting: Advanced Practice Midwife

## 2014-12-27 VITALS — BP 120/90 | HR 76 | Temp 98.3°F | Wt 181.0 lb

## 2014-12-27 DIAGNOSIS — Z3169 Encounter for other general counseling and advice on procreation: Secondary | ICD-10-CM

## 2014-12-27 NOTE — Progress Notes (Signed)
Pt called this morning asking if she could still come to GYN appt with Phary due to menses starting today. I informed pt about the visit and she decided she wanted to come.

## 2014-12-27 NOTE — Progress Notes (Signed)
Gyn here to discuss question on fertility, painful menses and uncomfortable with intercourse. LMP 12/26/14. Married for 5 yrs, h/o SVD X1 11/21/11. Has 123 yr old daughter at home. Was on many years of oral birth control pills, once pt stopped she conceived asap. Now has been having sex with husband,not using any contraception and still not getting pregnant again.Menarche age 44 yrs old, 3-4 day menses cycle, regular menses. Not taking medications except PNV. Medical Hx: significant for HTN with last pregnancy, resolved after delivery. PSH: tonsillectomy  OB: SVD 11/21/11 baby girl  O:vss  A: 43yo gyn preconception counseling, fertility awareness, painful intercourse  P:preconception counseling and fertility awareness education: after counseling pt sts is not having sex "that often" to get pregnant  Painful intercourse: after review and questions, pt's discomforts are more from full bladder and rectum/constipation. Also gas pain with sex  Lubrication issues: may try KY gel  Discussed female sexual functions, sex education  Discussed position changes for intercourse  100% visit spent above counseling  20 mins of sex, fertility,preconception  P.Analya Louissaint cnm

## 2015-04-15 ENCOUNTER — Other Ambulatory Visit (HOSPITAL_BASED_OUTPATIENT_CLINIC_OR_DEPARTMENT_OTHER): Payer: Self-pay

## 2015-04-15 ENCOUNTER — Telehealth (HOSPITAL_BASED_OUTPATIENT_CLINIC_OR_DEPARTMENT_OTHER): Payer: Self-pay

## 2015-04-15 DIAGNOSIS — L308 Other specified dermatitis: Secondary | ICD-10-CM

## 2015-04-15 MED ORDER — CLOBETASOL PROPIONATE 0.05 % EX CREA
TOPICAL_CREAM | Freq: Two times a day (BID) | CUTANEOUS | 2 refills | Status: DC
Start: 2015-04-15 — End: 2015-04-15

## 2015-04-15 MED ORDER — TRIAMCINOLONE ACETONIDE 0.1 % EX CREA
TOPICAL_CREAM | Freq: Two times a day (BID) | CUTANEOUS | 3 refills | Status: AC
Start: 2015-04-15 — End: 2016-04-14

## 2015-04-15 NOTE — Progress Notes (Signed)
Person calling on behalf of patient: Pharmacy    Denise Ryan is a 44 year old female who is a patient requesting refills for medication(s)      - medication(s) request: Clobetasol 0.05% cream  - last office visit: 12/27/2014  - last physical exam: 03/01/2014      Other Med Adult:  Most Recent BP Reading(s)  12/27/14 : 120/90          CHOLESTEROL (mg/dl)   Date Value   16/10/960402/02/2010 218 (H)   ----------    LOW DENSITY LIPOPROTEIN DIRECT (mg/dl)   Date Value   54/09/811902/02/2010 158 (H)   ----------    HIGH DENSITY LIPOPROTEIN (mg/dl)   Date Value   14/78/295602/02/2010 39   ----------    TRIGLYCERIDES (mg/dl)   Date Value   21/30/865702/02/2010 97   ----------        THYROID SCREEN TSH REFLEX FT4 (uIU/mL)   Date Value   10/23/2014 1.330   ----------        TSH (THYROID STIM HORMONE) (uIU/mL)   Date Value   10/28/2010 1.07   ----------      HEMOGLOBIN A1C (%)   Date Value   10/23/2014 5.2   ----------      No results found for: INR           Documented patient preferred pharmacies:    [facility] ([facility])  Phone: [phone] Fax: [phone]

## 2015-04-15 NOTE — Telephone Encounter (Signed)
-----   Message from Murvin DonningAilton Ferreira sent at 04/15/2015  9:25 AM EDT -----  Regarding: Med refill  [facility]    Person calling on behalf of patient: Patient (self)    May list multiple medications in this section    Medicine Name: clobetasol (TEMOVATE) 0.05 % cream  Dosage:     Frequency (how many pills, how many times a day):     Number of pills left:     Documented patient preferred pharmacies:   [facility] ([facility]  Phone: [phone] Fax: [phone]      Pharmacy Name:     Pharmacy Telephone Number:     Pharmacy  Fax Number:     CALL BACK NUMBER:     Cell phone:      Other phone:     Available times:     Patient's language of care: [place]    Patient needs a  interpreter.

## 2015-04-15 NOTE — Progress Notes (Signed)
Need to switch to other cream

## 2015-05-05 ENCOUNTER — Telehealth (HOSPITAL_BASED_OUTPATIENT_CLINIC_OR_DEPARTMENT_OTHER): Payer: Self-pay | Admitting: Ambulatory Care

## 2015-05-05 NOTE — Progress Notes (Signed)
Situation:RN Phone Triage:  Rash  T/C returned to [facility], 44 year old, female    Background:    Itchy red rash mostly flat all over body  Same thing happened 2 years, testing done couldn't find out why  Went to [place] a couple of months ago for 20 days  No sick contacts  Denies pain, swelling, pustules, fever, chills, warmth, sob, dizziness, weakness, chest tightness, difficulty breathing or swallowing  Went to [facility] ED 3 wks ago, was given Prednisone which helped until she stopped the medication  No new products, foods, detergents, etc  "I want to see my doctor"    Assessment:   Rash of unknown etiology     Recommendation:   Appt scheduled for tomorrow with PCP.  Encouraged pt to go to [facility] ED, [place] ED or [facility] ED if sxs worsen or change before her appt tomorrow.Pt expresses understanding and is in agreement with plan of care.

## 2015-05-05 NOTE — Telephone Encounter (Signed)
-----   Message from Murvin Donning sent at 05/05/2015  2:39 PM EDT -----  Regarding: speak to the nurse      Denise Ryan [id number], 44 year old, female, Telephone Information:  Home Phone      [phone]  Work Phone      Not on file.  Mobile          [phone]      Patient's Preferred Pharmacy:     [facility] ([facility])  Phone: [phone] Fax: [phone]      CONFIRMED TODAY: Lovett Sox NUMBER: [id number]  Best time to call back:any  Cell phone:   Other phone:    Available times:    Patient's language of care: [place]    Patient does not need an interpreter.    Patient's PCP: Denise Lewandowsky, MD    Person calling on behalf of patient: Patient (self)    Calls today to speak to nurse  Regarding medications:triamcinolone (KENALOG) 0.1 % cream      Thanks

## 2015-05-06 ENCOUNTER — Encounter (HOSPITAL_BASED_OUTPATIENT_CLINIC_OR_DEPARTMENT_OTHER): Payer: Self-pay

## 2015-05-06 ENCOUNTER — Ambulatory Visit (HOSPITAL_BASED_OUTPATIENT_CLINIC_OR_DEPARTMENT_OTHER): Payer: MEDICAID

## 2015-05-06 VITALS — BP 118/84 | HR 76 | Temp 97.1°F | Wt 179.5 lb

## 2015-05-06 DIAGNOSIS — L509 Urticaria, unspecified: Secondary | ICD-10-CM

## 2015-05-06 MED ORDER — RANITIDINE HCL 150 MG PO CAPS
150.00 mg | ORAL_CAPSULE | Freq: Every evening | ORAL | 11 refills | Status: AC
Start: 2015-05-06 — End: 2016-05-05

## 2015-05-06 MED ORDER — LORATADINE 10 MG PO TABS
10.00 mg | ORAL_TABLET | Freq: Every day | ORAL | 11 refills | Status: AC
Start: 2015-05-06 — End: 2016-05-05

## 2015-05-06 NOTE — Patient Instructions (Signed)
Call if it does not get better

## 2015-05-06 NOTE — Progress Notes (Signed)
S:   44 yo woman with itchy rash on abdomen, legs and arms for the last month or so.  She was seen at an urgen care for it and given 10 days of Prednisone which helped clear it up but it came right back afterward.  She has tried Benadryl for it which ehlps but makes her too sleepy.    She has had this same type of rash in the past. Had biopsy done but it showed nothing.    Not sure what sets it off.    Putting some steroid cream on it which helps some.      ROS:  General: otherwise feeling well, no fevers  ENT:  No congestion or mouth swelling  Resp:  No cough or trouble breahting, no wheezing.  GI:  No abed pain          Past Medical History    Down's syndrome     Comment: Cousin : Mother 77     HTN (hypertension)     Comment: Labile Blood pressures no medications    Kidney stone 04/10/2008    Other advanced maternal age in pregnancy     UTI (lower urinary tract infection) 05/01/2008         Social History   Marital status: Married  Spouse name: N/A    Years of education: N/A  Number of children: N/A     Occupational History  None on file     Social History Main Topics   Smoking status: Former Smoker     Smokeless tobacco: Never Used    Comment: 1 cig /week     Alcohol use Yes    Comment: socially     Drug use: No    Sexual activity: Yes    Partners: Male    Pharmacist, hospital protection: Pill    Comment: Diane 35 from [place]      Other Topics Concern    Military Service No    Blood Transfusions No    Caffeine Concern No    Occupational Exposure No    Hobby Hazards No    Sleep Concern No    Stress Concern No    Weight Concern No    Special Diet No    Back Care No    Exercise No    Bike Helmet No    Seat Belt No    Self-Exams No     Social History Narrative        Patient is from [place]. Lives with her husband and daughter, Jeanette Caprice, born in 12/12    Runs a [employer]    No tobacco, safe at home        01/23/14    Patient lives with her husband and 110 yo daughter. Seeking pregnancy. 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                               PE:  BP 118/84  Pulse 76  Temp 97.1 F (36.2 C) (Temporal)  Wt 81.4 kg (179 lb 8 oz)  LMP 05/04/2015  SpO2 99%  BMI 30.81 kg/m2  General:  Appears well NAD  Skin:  Bright red splotches over abdomen, legs, arms and some brown, rough spots with excoriations on legsHEENT:  Mucous membranes moist, PERRL, Nose:  No congestion, Mouth:  No lesions  Neck:  Supple, no mass or thyroidmegaly  Lungs:  Clear  Cor:  S1S2 reg no M no S3 or S4  Psych:  Full affect, no agitation      A/P:  44 yo woman    (L50.9) Urticaria  Comment: Seems most likely dx and she has hx of this in the past.  Prednisone given orally x 10 days cleared it but then it returned right after she stopped it  Plan: HEALTH CARE PROXY, loratadine (CLARITIN) 10 MG         tablet, ranitidine (ZANTAC) 150 MG capsule        Trying usual Rx for hives.     Call me if it does not improve ver the next few weeks and w/c allergy testing.

## 2015-05-07 ENCOUNTER — Encounter (HOSPITAL_BASED_OUTPATIENT_CLINIC_OR_DEPARTMENT_OTHER): Payer: Self-pay

## 2015-05-14 ENCOUNTER — Telehealth (HOSPITAL_BASED_OUTPATIENT_CLINIC_OR_DEPARTMENT_OTHER): Payer: Self-pay | Admitting: Ambulatory Care

## 2015-05-14 DIAGNOSIS — L309 Dermatitis, unspecified: Secondary | ICD-10-CM

## 2015-05-14 MED ORDER — BETAMETHASONE DIPROPIONATE 0.05 % EX CREA
TOPICAL_CREAM | Freq: Two times a day (BID) | CUTANEOUS | 1 refills | Status: AC
Start: 2015-05-14 — End: 2015-06-13

## 2015-05-14 NOTE — Telephone Encounter (Signed)
-----   Message from Natalia LeatherwoodSteve Montaque sent at 05/14/2015 12:28 PM EDT -----  Regarding: no change in symptom  Contact: [phone]      Earlie LouMonica Heyboer [phone], 44 year old, female, Telephone Information:  Home Phone      [phone]  Work Phone      Not on file.  Mobile          [phone]      Patient's Preferred Pharmacy:     [facility]  Phone: [phone] Fax: [phone]    [id number] [place] [place] - [address]  Phone: [phone] Fax: [phone]      CONFIRMED TODAY:     Cleotis LemaCALL BACK NUMBER: [phone]  Best time to call back: anytime   Cell phone:   Other phone:    Available times:    Patient's language of care: [place]    Patient does not need an interpreter.    Patient's PCP: Shelah LewandowskyAVRA GOLDMAN, MD    Person calling on behalf of patient: Patient (self)    Calls today saw provider 06/21 and was give med, unfortunately, the patient said her symptom have not improve. Asking if RN could call her back to advise her what can be done for her.

## 2015-05-14 NOTE — Progress Notes (Signed)
Via interpreter left vm message to call back

## 2015-05-14 NOTE — Progress Notes (Signed)
Can you please let her know that her insurance will not pay for the Clobetasol so I am sending a cream that is just like it.  Hopefully her insurance will cover it.    Thanks,    Deeann DowseAvra

## 2015-05-14 NOTE — Progress Notes (Signed)
Situation:RN Phone Triage: rash  [facility] 44 [facility], female via interpreter Jearld Adjutant(Marina)    Background:    Seen by Dr. Elonda HuskyGoldman on 6/21 for urticaria  Has been taking zantac and claritin as advised  Medications not helping, no change in symptoms, very itchy  "I've had this allergy rash for many years and the only thing that helps is   Clobetesol cream."  [facility]  Denies sob, swelling, difficulty breathing, swallowing, dizziness, chest tightness, pain    Assessment:   rash    Recommendation:   Will forward to PCP to review and advise.   Encouraged pt to call back sooner if sxs worsen or change.   Pt expresses understanding and is in agreement with plan of care.

## 2015-05-15 ENCOUNTER — Telehealth (HOSPITAL_BASED_OUTPATIENT_CLINIC_OR_DEPARTMENT_OTHER): Payer: Self-pay | Admitting: Registered Nurse

## 2015-05-15 NOTE — Telephone Encounter (Signed)
-----   Message from Natalia LeatherwoodSteve Montaque sent at 05/15/2015  8:32 AM EDT -----  Regarding: RTC   Contact: [phone]      Earlie LouMonica Dass [phone], 44 year old, female, Telephone Information:  Home Phone      [phone]  Work Phone      Not on file.  Mobile          [phone]      Patient's Preferred Pharmacy:     [facility] ([place]  Phone: [phone] Fax: [phone]    [id number] [place], [place] - [address]  Phone: [phone] Fax: [phone]      CONFIRMED TODAY:     Cleotis LemaCALL BACK NUMBER: [phone]  Best time to call back: anytime   Cell phone:   Other phone:    Available times:    Patient's language of care: [place] Brazilian    Patient does not need an interpreter.    Patient's PCP: Shelah LewandowskyAVRA GOLDMAN, MD    Person calling on behalf of patient: Patient (self)    Calls today patient request a call back Return call

## 2015-05-15 NOTE — Progress Notes (Signed)
Spoke with patient, declined interpreter   Relayed message and plan  To call if symptoms persist worsen  Verbalized back and agrees with plan

## 2015-05-15 NOTE — Progress Notes (Signed)
Error dulplicate

## 2016-07-22 ENCOUNTER — Other Ambulatory Visit (HOSPITAL_BASED_OUTPATIENT_CLINIC_OR_DEPARTMENT_OTHER): Payer: Self-pay

## 2016-07-22 NOTE — Telephone Encounter (Signed)
Called pt's home number, no answer left a vm to contact clinic.

## 2016-08-25 ENCOUNTER — Other Ambulatory Visit (HOSPITAL_BASED_OUTPATIENT_CLINIC_OR_DEPARTMENT_OTHER): Payer: Self-pay

## 2016-08-25 NOTE — Telephone Encounter (Signed)
Called pt, no answer, mailbox full cant leave a message

## 2016-11-01 ENCOUNTER — Other Ambulatory Visit (HOSPITAL_BASED_OUTPATIENT_CLINIC_OR_DEPARTMENT_OTHER): Payer: Self-pay

## 2016-11-01 NOTE — Telephone Encounter (Signed)
Called pt to inform that they are over due for Pap/Hpv. No answer, Left a message on voicemail to contact the clinic to make an appt at their earliest convince.

## 2016-12-17 ENCOUNTER — Encounter (HOSPITAL_BASED_OUTPATIENT_CLINIC_OR_DEPARTMENT_OTHER): Payer: Self-pay | Admitting: Medical

## 2016-12-17 ENCOUNTER — Ambulatory Visit (HOSPITAL_BASED_OUTPATIENT_CLINIC_OR_DEPARTMENT_OTHER): Payer: Self-pay | Admitting: Medical

## 2016-12-17 VITALS — BP 126/86 | HR 84 | Temp 97.5°F | Wt 180.0 lb

## 2016-12-17 DIAGNOSIS — Z23 Encounter for immunization: Secondary | ICD-10-CM

## 2016-12-17 DIAGNOSIS — Z1231 Encounter for screening mammogram for malignant neoplasm of breast: Secondary | ICD-10-CM

## 2016-12-17 DIAGNOSIS — Z0100 Encounter for examination of eyes and vision without abnormal findings: Secondary | ICD-10-CM

## 2016-12-17 DIAGNOSIS — Z1239 Encounter for other screening for malignant neoplasm of breast: Secondary | ICD-10-CM

## 2016-12-17 DIAGNOSIS — H538 Other visual disturbances: Secondary | ICD-10-CM

## 2016-12-17 LAB — SCREENING TEST VISUAL ACUITY QUANTITATIVE BILAT

## 2016-12-17 MED ORDER — NEOMYCIN-BACITRACIN ZN-POLYMYX 5-400-10000 OP OINT: g | Freq: Three times a day (TID) | 0 refills | 0 days | Status: AC

## 2016-12-17 MED ORDER — NEOMYCIN-BACITRACIN ZN-POLYMYX 5-400-10000 OP OINT
TOPICAL_OINTMENT | Freq: Three times a day (TID) | OPHTHALMIC | 0 refills | Status: AC
Start: 2016-12-17 — End: 2016-12-27

## 2016-12-17 NOTE — Progress Notes (Signed)
Subjective     Lorenna Lurry is a 46 year old female presents with concerns about her right eye.   She can see everything but its blurry.   It has been like this for about one month. Feels like a foreign object in her eye. But per vision screen 20/20 left eye, 20/32 right eye.   Sometimes she does get headaches but its alleviates with tylenol.   No dizziness.  Years ago mom states she had a spot in her eye and they found this was caused by sinusitis.   She does have pressure on the right sinus ( maxillary sinus).       Patient Active Problem List:     Pes planus     Family history of Down syndrome     Dizziness      No current outpatient prescriptions on file.  No current facility-administered medications for this visit.   Review of Patient's Allergies indicates:   Norfloxacin             Hives  Social History    Marital status: Married             Spouse name:                       Years of education:                 Number of children:               Social History Main Topics    Smoking status: Never Smoker                                                                Smokeless tobacco: Never Used                        Alcohol use: Yes                Comment: socially     Drug use: No              Sexual activity: Yes               Partners with: Female       Birth control/protection: Pill       Comment: Diane 35 from [place]     Other Topics            Concern  Financial planner        No  Blood Transfusions      No  Caffeine Concern        No  Occupational Exposure   No  Hobby Hazards           No  Sleep Concern           No  Stress Concern          No  Weight Concern          No  Special Diet            No  Back Care               No  Exercise  No  Bike Helmet             No  Seat Belt               No  Self-Exams              No    Social History Narrative        Patient is from [place]. Lives with her husband and daughter, Jeanette Caprice, born in 12/12    Runs a [place] [employer]    No tobacco,  safe at home        01/23/14    Patient lives with her husband and 65 yo daughter. Seeking pregnancy. 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                              Past Surgical History:  age 75 : TONSILLECTOMY ONE-HALF AGE 48/>    Family History    Allergies Mother     Psychiatric Illness Mother     Comment: depression    Dermatology Father     Comment: leprosy     GI Maternal Grandmother     Comment: uterine cancer     Cancer - Other Father     Comment: kidney cancer               Objective     All ROS reviewed and discussed and are otherwise negative unless listed above.     PHYSICAL EXAM:    VITALS & GENERAL APPEARANCE - BP (!) 126/100 (Site: LA, Position: Sitting, Cuff Size: Reg)  Pulse 84  Temp 97.5 F (36.4 C) (Temporal)  Wt 81.6 kg (180 lb)  LMP 12/13/2016 (Exact Date)  SpO2 98%  BMI 30.9 kg/m2; Pain Score: 0 (0/10). Alert, well developed, well nourished, no acute distress  HEENT - Eyes are anicteric, have normal pupils and conjugate gaze.EOMI, unable to appreciate intraocular changes on exam,  Hearing normal to conversation.EAC normal bilaterally, no erythema or edema. TM bilaterally gray and translucent, light reflex visible bilaterally. Nares patent, Sinuses non-tender. Pharynx has no exudate or masses  NECK - Neck supple. No adenopathy.   CHEST -Lungs clear to auscultation bilaterally, no wheeze or inc work of breathing.   CARDIOVASCULAR -  RRR. No murmurs, clicks, gallops or rubs.  NEUROLOGIC - Gait , speech, movement and mental status normal to observation.  SKIN - skin color, texture, turgor are normal in the exposed areas.    1. Blurred vision  Unable to appreciate any opthalmologic changes today, will refer opthalmology for further eval.   - REFERRAL TO OPHTHALMOLOGY ( INT)    2. Encounter for vision screening  20/20 L eye   20/32 R eye   - SCREENING TEST VISUAL ACUITY QUANTITATIVE BILAT    3. Screening for malignant neoplasm of breast    - [facility] MAMMOGRAPHY SCREENING  BILATERAL W CAD; Future    4. Need for prophylactic vaccination and inoculation against influenza    - IMMUNIZATION ADMIN SINGLE, RN  - PR IIV4 VACC PRESRV FREE 0.5 ML DOS FOR IM USE      I have reviewed the past medical, surgical, social and family history and updated these sections of EpicCare as relevant. All interim labs, test results, and consult notes were reviewed and discussed with patient. Medications were reconciled during this visit and a current medication list was given to the patient at the  end of the visit.      follow up if any changes including headache, sudden vision loss, or any other new concerns.     Colette RibasNicole Halynn Reitano, PA-C

## 2016-12-17 NOTE — Progress Notes (Signed)
Influenza Vaccine Procedure  December 17, 2016    1. Has the patient received the information for the influenza vaccine? Yes    2. Does the patient have any of the following contraindications?  Allergy to eggs? No  Allergic reaction to previous influenza vaccines? No  Any other problems to previous influenza vaccines? No  Paralyzed by Guillain-Barre syndrome?  No  Current moderate or severe illness? No  Allergy to contact lens solution? No    3. The vaccine has been administered in the usual fashion.     Immunization information reviewed. Current VIS reviewed and given to patient/ guardian. Verbal assent obtained from patient/ guardian.  See immunization/Injection module or chart review for date of publication and additional information. Verbal assent obtained from patient/guardian. Comfort measures for possible side effects reviewed.

## 2016-12-23 ENCOUNTER — Ambulatory Visit (HOSPITAL_BASED_OUTPATIENT_CLINIC_OR_DEPARTMENT_OTHER): Payer: Self-pay | Admitting: Medical

## 2016-12-23 DIAGNOSIS — Z1231 Encounter for screening mammogram for malignant neoplasm of breast: Secondary | ICD-10-CM

## 2016-12-23 LAB — MA SCREENING MAMMO BILATERAL DIGITAL WITH DBT & CAD

## 2016-12-23 NOTE — Addendum Note (Signed)
Addended by: Laretta BolsterEVANS, DANIELLE on: 12/23/2016 11:39 AM     Modules accepted: Orders

## 2017-03-10 ENCOUNTER — Ambulatory Visit (HOSPITAL_BASED_OUTPATIENT_CLINIC_OR_DEPARTMENT_OTHER): Payer: PRIVATE HEALTH INSURANCE | Admitting: Ophthalmology

## 2017-03-10 ENCOUNTER — Encounter (HOSPITAL_BASED_OUTPATIENT_CLINIC_OR_DEPARTMENT_OTHER): Payer: Self-pay | Admitting: Ophthalmology

## 2017-03-10 DIAGNOSIS — H524 Presbyopia: Secondary | ICD-10-CM

## 2017-03-10 DIAGNOSIS — H40003 Preglaucoma, unspecified, bilateral: Secondary | ICD-10-CM

## 2017-03-10 DIAGNOSIS — H18831 Recurrent erosion of cornea, right eye: Secondary | ICD-10-CM

## 2017-03-10 DIAGNOSIS — H52203 Unspecified astigmatism, bilateral: Secondary | ICD-10-CM

## 2017-03-10 NOTE — Progress Notes (Signed)
Impression:  Glaucoma suspect:  Mildly asymmetric optic nerve cupping OD>OS.  IOP normal.  Doubt disease however patient has the sense that there is decreased vision OD.    S/p thermal injury OD:  FBS since episode.   Intermittent erosion?  Mild hyperopic astigmatism/presbyopia    Plan:  Rx given for spectacle lenses  Tears OD qid  Return for formal visual field testing and optic nerve imaging.    Needs pachymetry

## 2017-03-10 NOTE — Nursing Note (Signed)
46 year old female new pt here for comprehensive eye exam,      Pt states:  Feels something in side the eye, discomfort OD, splash hot oil 3 month ago,  VA blurry OD, fine OS, never had glasses in the past.    Denies pain,red,floaters,flashes of light.

## 2017-03-12 DIAGNOSIS — H40003 Preglaucoma, unspecified, bilateral: Secondary | ICD-10-CM | POA: Insufficient documentation

## 2017-03-12 MED ORDER — POLYVINYL ALCOHOL 1.4 % OP SOLN
1.00 [drp] | Freq: Four times a day (QID) | OPHTHALMIC | 0 refills | Status: AC
Start: 2017-03-12 — End: 2017-05-12

## 2017-03-12 MED ORDER — POLYVINYL ALCOHOL 1.4 % OP SOLN: 1 [drp] | mL | Freq: Four times a day (QID) | OPHTHALMIC | 0 refills | 0 days | Status: AC

## 2017-04-15 ENCOUNTER — Ambulatory Visit (HOSPITAL_BASED_OUTPATIENT_CLINIC_OR_DEPARTMENT_OTHER): Payer: PRIVATE HEALTH INSURANCE

## 2017-04-28 ENCOUNTER — Other Ambulatory Visit (HOSPITAL_BASED_OUTPATIENT_CLINIC_OR_DEPARTMENT_OTHER): Payer: Self-pay

## 2017-04-28 NOTE — Telephone Encounter (Signed)
Have outreached pt 3 times without and successful response from patient. Mailing letter

## 2017-06-03 ENCOUNTER — Other Ambulatory Visit (HOSPITAL_BASED_OUTPATIENT_CLINIC_OR_DEPARTMENT_OTHER): Payer: Self-pay | Admitting: Family Medicine

## 2017-06-03 NOTE — Telephone Encounter (Signed)
-----   Message from Larena GlassmanJunia Coelho sent at 06/03/2017  3:10 PM EDT -----  Regarding: Refill  Contact: [phone]  Denise LouMonica Ryan [phone], 46 year old, female    Calls today:  Refill    !! Before starting refill request, check EPIC to see if encounter for this medication already exists !!    (May list multiple medications in this section)  Medicine Name: clobetasol (TEMOVATE) 0.05 % cream       Documented patient preferred pharmacies:    [facility] [place] [place] - [address]  Phone: [phone] Fax: [phone]  Person calling on behalf of patient: Patient (self)    CALL BACK NUMBER: [phone]      Patient's language of care: [place]Portuguese ([place]ian)    Patient needs a [place]Portuguese interpreter.    Patient's PCP: Elray McgregorFlorentine Pepin, MD, MPH

## 2017-06-03 NOTE — Progress Notes (Unsigned)
Dear nurses,   This patient is asking for a refill of this very strong steroid cream. Looks like it was discontinued in 2016. I have never met this patient. Please could you call her and see what she needs this for? Please say that this is a very strong steroid cream that is only supposed to be used short term.   Thanks,   Florentine          Due to the language barrier, the phone call was conducted in [place] with an interpreter. The interpreter's ID# [id number]  . The interpreter was on the phone during the entire phone call.    Spoke with patient  "I have an allergy that nobody known what causes and it is the only ointment that works"   Going to [place] on Thursday for 20 days   Also overdue for PAP/HPV    Rash   - looks like a bug bite, gets itchy and gets bigger and bigger on legs  - I get it all over my body, starts on legs and belly   -     U/C appt made for Monday

## 2017-06-03 NOTE — Progress Notes (Unsigned)
PER Pharmacy,Denise Ryan is a 46 year old female has requested a refill ofclobetasol .  ?  Last Office Visit: 12/17/2016 with Colette RibasGaden, Nicole   Last Physical Exam: 03/01/2014  ?  Other Med Adult:  Most Recent BP Reading(s)  12/17/16 : 126/86          Cholesterol (mg/dl)   Date Value   16/10/960402/02/2010 218 (H)   ----------    LOW DENSITY LIPOPROTEIN DIRECT (mg/dl)   Date Value   54/09/811902/02/2010 158 (H)   ----------    HIGH DENSITY LIPOPROTEIN (mg/dl)   Date Value   14/78/295602/02/2010 39   ----------    TRIGLYCERIDES (mg/dl)   Date Value   21/30/865702/02/2010 97   ----------        THYROID SCREEN TSH REFLEX FT4 (uIU/mL)   Date Value   10/23/2014 1.330   ----------        TSH (THYROID STIM HORMONE) (uIU/mL)   Date Value   10/28/2010 1.07   ----------      HEMOGLOBIN A1C (%)   Date Value   10/23/2014 5.2   ----------    No results found for: POCA1C      No results found for: INR      SODIUM (mmol/L)   Date Value   12/19/2009 137   ----------      POTASSIUM (mmol/L)   Date Value   12/19/2009 4.4   ----------          CREATININE (mg/dl)   Date Value   84/69/629501/02/2012 0.7   ----------  Documented patient preferred pharmacies:    [facility] [place], [place] - [address]  Phone: [phone] Fax: [phone]

## 2017-06-06 ENCOUNTER — Ambulatory Visit (HOSPITAL_BASED_OUTPATIENT_CLINIC_OR_DEPARTMENT_OTHER): Payer: PRIVATE HEALTH INSURANCE | Admitting: Family Medicine

## 2017-07-19 ENCOUNTER — Other Ambulatory Visit (HOSPITAL_BASED_OUTPATIENT_CLINIC_OR_DEPARTMENT_OTHER): Payer: Self-pay

## 2017-07-19 NOTE — Telephone Encounter (Signed)
Outreach PAP SMEAR    Unable to reach pt. Unable to leave a message on vm. I will try back at a later time.  Lynann BeaverBrenda Idil Maslanka, 07/19/2017, 1:51 PM

## 2017-08-26 ENCOUNTER — Ambulatory Visit (HOSPITAL_BASED_OUTPATIENT_CLINIC_OR_DEPARTMENT_OTHER): Payer: PRIVATE HEALTH INSURANCE | Admitting: Family Medicine

## 2017-11-09 ENCOUNTER — Other Ambulatory Visit (HOSPITAL_BASED_OUTPATIENT_CLINIC_OR_DEPARTMENT_OTHER): Payer: Self-pay

## 2017-11-09 NOTE — Telephone Encounter (Signed)
Outreach cervical cancer screening    Unable to reach. Left message on vm. Denise Ryan, 11/09/2017, 2:21 PM

## 2017-12-01 ENCOUNTER — Other Ambulatory Visit (HOSPITAL_BASED_OUTPATIENT_CLINIC_OR_DEPARTMENT_OTHER): Payer: Self-pay

## 2017-12-01 NOTE — Telephone Encounter (Signed)
Outreach for paps    Unable to reach [place], left a message on vm to return call and schedule appt for pap exam.  Denise Ryan, 12/01/2017, 1:38 PM

## 2017-12-21 ENCOUNTER — Other Ambulatory Visit (HOSPITAL_BASED_OUTPATIENT_CLINIC_OR_DEPARTMENT_OTHER): Payer: Self-pay

## 2017-12-21 NOTE — Telephone Encounter (Signed)
Pt is due for a pap smear. All 3 attempts have been made by phone. Unable to reach pt. I am mailing out a letter as a 4th attempt.  Lynann BeaverBrenda Morris [place], 12/21/2017, 2:12 PM

## 2018-09-29 ENCOUNTER — Other Ambulatory Visit (HOSPITAL_BASED_OUTPATIENT_CLINIC_OR_DEPARTMENT_OTHER): Payer: Self-pay

## 2018-09-29 NOTE — Telephone Encounter (Signed)
Pt due for pap smear. Unable to leave message on vm. vm full. Will try calling back at a later time.  Lynann BeaverBrenda Shatia [place], [place], 09/29/2018

## 2018-11-03 ENCOUNTER — Other Ambulatory Visit (HOSPITAL_BASED_OUTPATIENT_CLINIC_OR_DEPARTMENT_OTHER): Payer: Self-pay

## 2018-11-03 NOTE — Telephone Encounter (Signed)
Pt due for pap smear. Unable to reach pt left message on vm.  Lynann BeaverBrenda Tanieka [place]all, [place], 11/03/2018

## 2018-12-06 ENCOUNTER — Other Ambulatory Visit (HOSPITAL_BASED_OUTPATIENT_CLINIC_OR_DEPARTMENT_OTHER): Payer: Self-pay

## 2018-12-06 NOTE — Telephone Encounter (Signed)
Outreach  Pt due for pap smear. Unable to reach pt and unable to leave msg on vm. I will mail out a letter to the pt instead.  Lynann Beaver, [place], 12/06/2018

## 2021-01-22 ENCOUNTER — Encounter (HOSPITAL_BASED_OUTPATIENT_CLINIC_OR_DEPARTMENT_OTHER): Payer: Self-pay

## 2023-03-31 ENCOUNTER — Encounter (HOSPITAL_BASED_OUTPATIENT_CLINIC_OR_DEPARTMENT_OTHER): Payer: Self-pay

## 2023-04-26 ENCOUNTER — Ambulatory Visit (HOSPITAL_BASED_OUTPATIENT_CLINIC_OR_DEPARTMENT_OTHER)
Payer: No Typology Code available for payment source | Admitting: Student in an Organized Health Care Education/Training Program

## 2024-02-09 ENCOUNTER — Other Ambulatory Visit: Payer: Self-pay

## 2024-07-05 IMAGING — MR CRANIO E COLUNAS
6 of 35 series · 12 of 48 positions shown · IV contrast (0)
Comparison: none

------------- REPORT [id number] -------------
METHODOLOGY:
Examination performed with SE (spin-echo), FSE (fast spin-echo), GR (gradient-echo) and FSE-IR (FLAIR) sequences, in multiple cut planes, before and after intravenous administration of the paramagnetic contrast agent.
The study was complemented by the use of advanced Artificial Intelligence "Recon-DL" software.

ANALYSIS:
Foci of hypersignal in T2 and FLAIR in the periventricular white matter, corona radiata and semioval centers, predominantly with an oval morphology, with the major axis perpendicular to the ependymal surface of the lateral ventricles, inferring perivenular tropism.
MAGNETIC RESONANCE IMAGING OF THE BRAIN
In the posterior fossa, there are foci of hypersignal in T2 in the pons, medulla oblongata, middle cerebellar peduncles and cerebellar hemispheres.
Focus of justacortical signal alteration in the right precentral gyrus, with a component of magnetic susceptibility permeating.
Remaining brain parenchyma with preserved morphology and signal.
Absence of hydrocephalus.
Absence of images compatible with expansive process.
Absence of signs of restriction to the diffusion of water molecules in the present examination.
After injection of gadolinium, no lesions with breakdown of the blood-brain barrier are characterized.
TECHNIQUE: Examination performed with T1 and T2 weighted sequences, before and after intravenous administration of the paramagnetic contrast agent.
The study was supplemented with the use of "Recon-DL" advanced Artificial Intelligence software.

[Series 2: FLAIR · sagittal · 1.4mm · 0.50mm/px · 7 of 220 slices shown (1 of 2)]
[im 1/220]
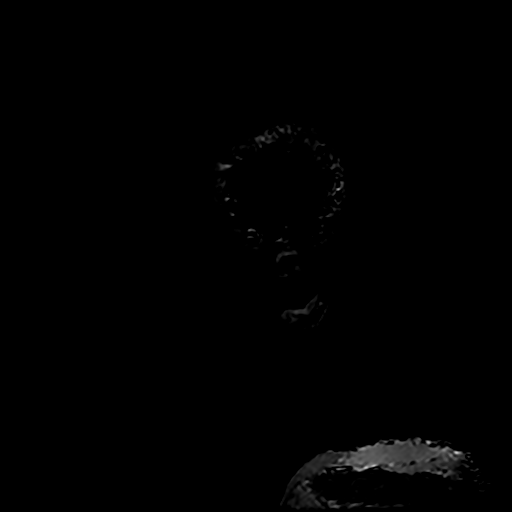
[im 37/220]
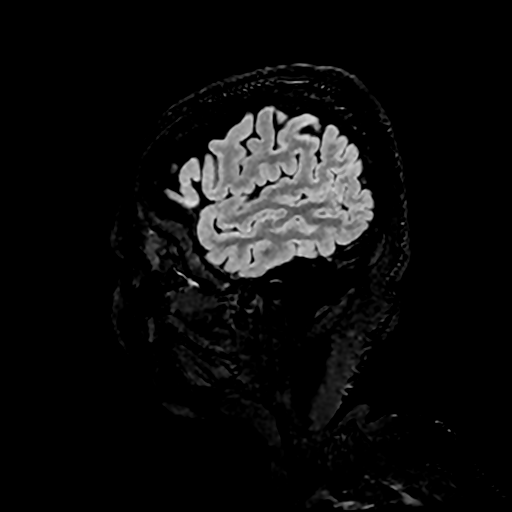
[im 74/220]
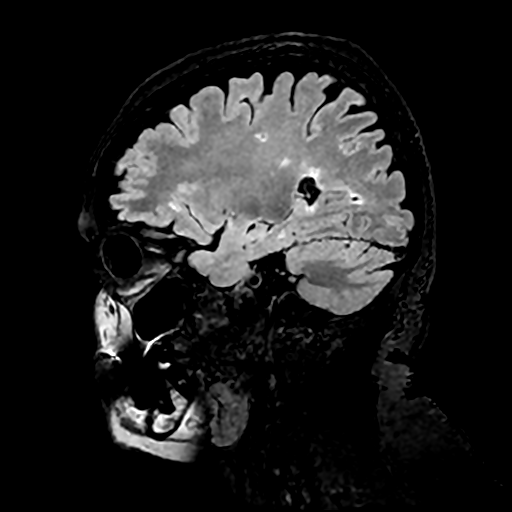
[im 110/220]
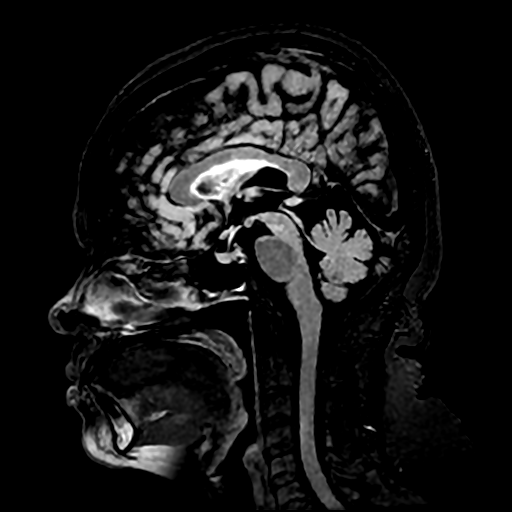
[im 147/220]
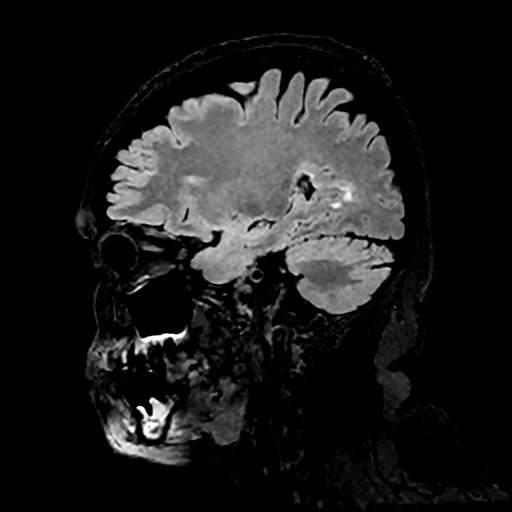
[im 183/220]
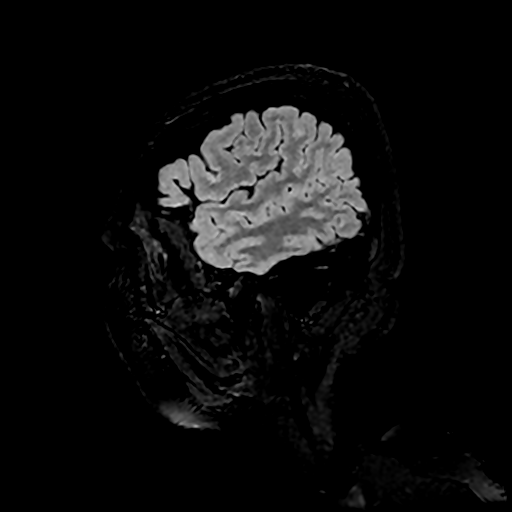
[im 220/220]
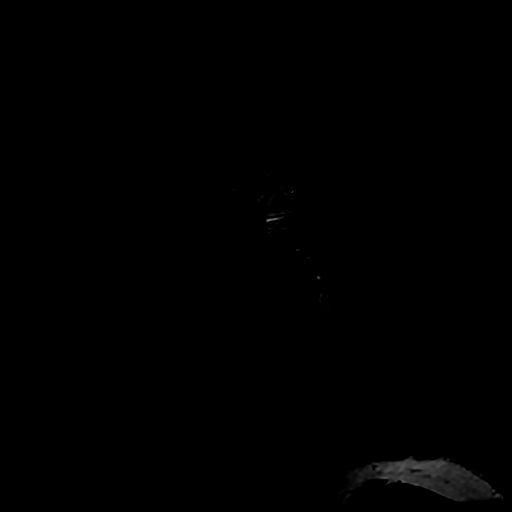

[Series 7: FLAIR · axial · 5.0mm · 0.49mm/px · 1 of 24 slices shown (2 of 2)]
[im 1/24]
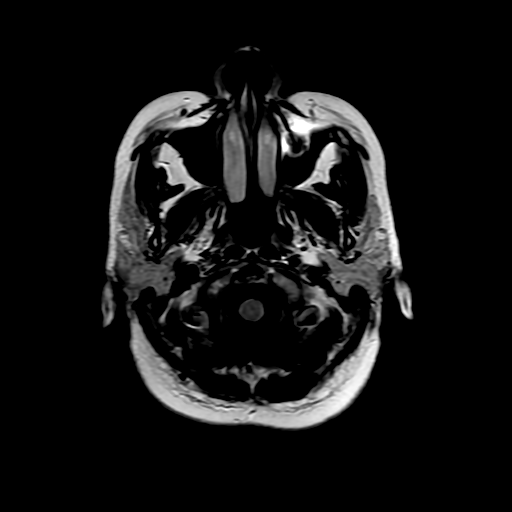

[Series 8: T2 · axial · 5.0mm · 0.49mm/px · 1 of 24 slices shown (1 of 2)]
[im 1/24]
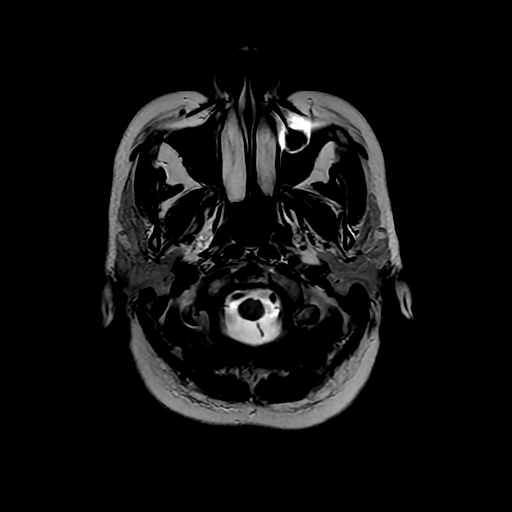

[Series 9: DWI · axial · 5.0mm · 0.98mm/px · 1 of 48 slices shown]
[im 1/48]
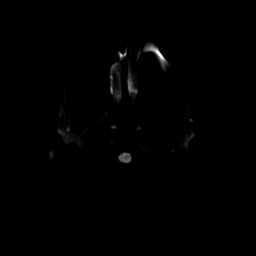

[Series 14: T2 · sagittal · 3.0mm · 0.21mm/px · 1 of 12 slices shown (2 of 2)]
[im 1/12]
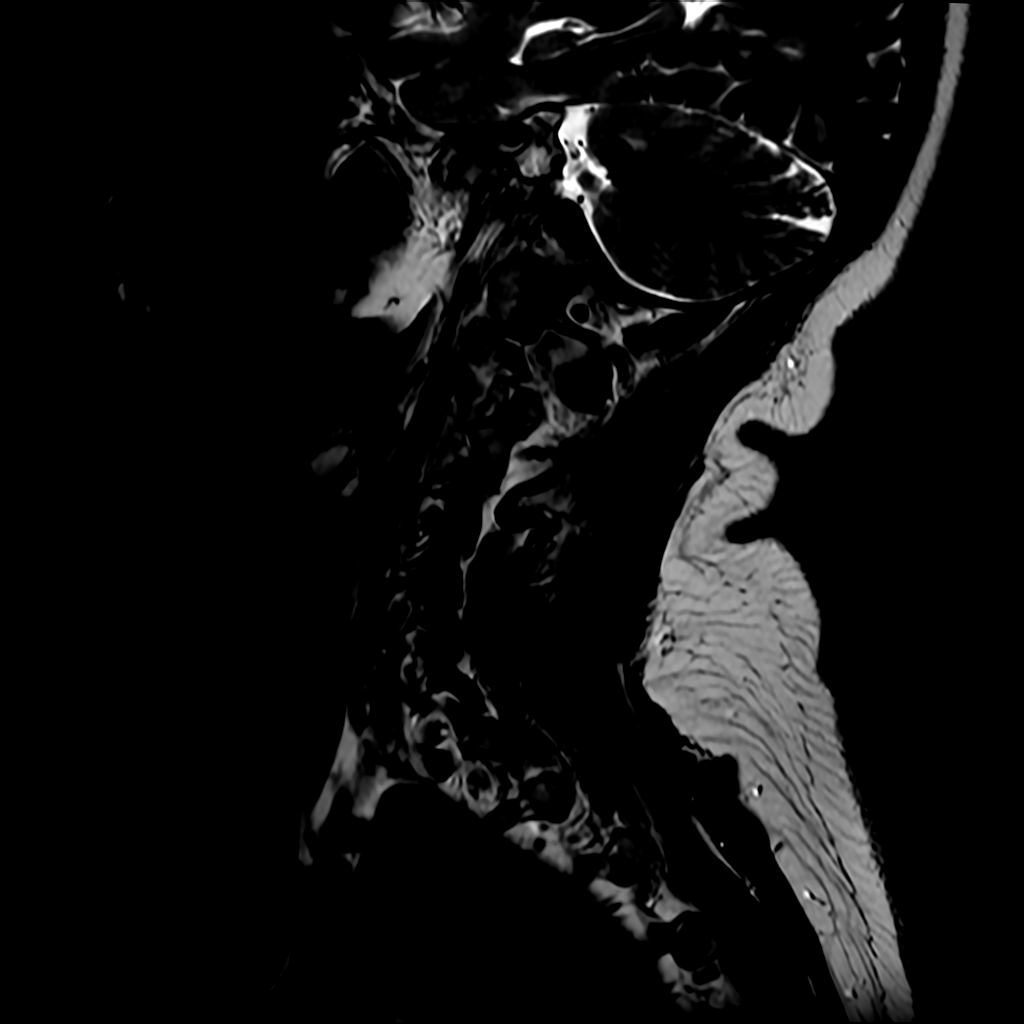

[Series 950: ADC · axial · 5.0mm · 0.98mm/px · 1 of 24 slices shown]
[im 1/24]
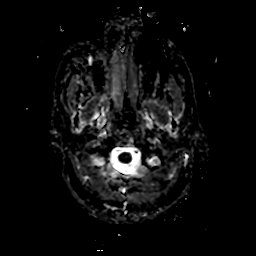

[12 of 48 positions shown; findings below may reference images not displayed]

IMPRESSION: Foci of signal alteration in the supra and infratentorial white matter, whose characteristics in an appropriate clinical context may suggest demyelinating disease. They meet imaging criteria for dissemination in space, with no signs of current inflammatory activity observed.


------------- REPORT [id number] -------------
METHODOLOGY:
The examination was performed with T1 and T2 weighted sequences, before and after intravenous administration of the paramagnetic contrast agent.
The study was complemented by the use of advanced Artificial Intelligence "Recon-DL" software.
ANALYSIS:
Cranio-cervical junction with an anatomical appearance.
Scattered foci of altered medullary signal, with greater prominence at the level of C4 and C5 on the left, without enhancement after contrast administration.
CERVICAL SPINE MAGNETIC RESONANCE IMAGING
The vertebral canal exhibits normal dimensions throughout the evaluated extent.
The vertebral bodies show preserved height and posterior alignment, with discrete marginal osteophytes
Disc dehydration.
Posterior disc protrusions at C3-C4, C5-C6 and C6-C7, touching the dural sac, without spinal cord compression.
Cervical interapophyseal arthrosis.
Free neural foramina.
IMPRESSION: Foci of altered medullary signal, whose characteristics, in an appropriate clinical context, may suggest demyelinating disease.
Discrete marginal osteophytosis.
Disc dehydration.
Posterior disc protrusions at C3-C4, C5-C6 and C6-C7, touching the dural sac, without spinal cord compression.
Cervical interapophyseal arthrosis.


------------- REPORT [id number] -------------
METHODOLOGY:
Examination performed with T1- and T2-weighted sequences, before and after intravenous administration of the paramagnetic contrast agent.
The study was complemented by the use of advanced Artificial Intelligence "Recon-DL" software.
ANALYSIS:
The vertebral canal exhibits normal dimensions throughout the evaluated extent.
Small focus of signal alteration in the medullary cone at the level of T11-T12, without enhancement with contrast medium.
MRI OF THE THORACIC SPINE
The vertebral bodies show preserved height and posterior alignment, with discrete marginal osteophytes.
Disc hypohydration.
Posterior disc protrusions at T6-T7 and T8-T9, touching the dural sac, without spinal cord compression.
Interfacet joints without significant changes.
Free neural foramina.
IMPRESSION: Focus of medullary signal alteration, whose characteristics, in an appropriate clinical context, may suggest demyelinating disease.
Discrete marginal osteophytosis.
Disc hypohydration.
Posterior disc protrusions at T6-T7 and T8-T9, touching the dural sac, without spinal cord compression.


------------- REPORT [id number] -------------
Report:
The vertebral canal exhibits normal dimensions throughout the evaluated extent.
LUMBOSACRAL SPINE MAGNETIC RESONANCE IMAGING
Accentuation of the physiological curvature of the lumbar spine in the examination position.
The vertebral bodies present preserved height and posterior alignment, with marginal osteophytes.
Disc dehydration at L3-L4 and L4-L5.
Disc bulges at L3-L4 and L4-L5, touching the dural sac, without nerve root compressions.
Lumbar interapophyseal arthrosis.
Free neural foramina.
The conus medullaris is topical, with normal caliber and signal intensity.
Nerve roots of the cauda equina with morphology and anatomical distribution.
IMPRESSION: Accentuation of the physiological curvature of the lumbar spine in the examination position.
Marginal osteophytosis.
Disc dehydration at L3-L4 and L4-L5.
Disc bulges at L3-L4 and L4-L5, touching the dural sac, without nerve root compressions.
Lumbar interapophyseal arthrosis.
